# Patient Record
Sex: Female | Born: 1955 | Race: White | Hispanic: No | Smoking: Never smoker
Health system: Southern US, Community
[De-identification: ages and names within clinical notes are randomized; demographics above are authoritative.]

## PROBLEM LIST (undated history)

## (undated) DIAGNOSIS — C801 Malignant (primary) neoplasm, unspecified: Secondary | ICD-10-CM

## (undated) DIAGNOSIS — Z86718 Personal history of other venous thrombosis and embolism: Secondary | ICD-10-CM

## (undated) HISTORY — PX: ABDOMINAL HYSTERECTOMY: SHX81

---

## 2016-02-15 ENCOUNTER — Ambulatory Visit
Admission: EM | Admit: 2016-02-15 | Discharge: 2016-02-15 | Disposition: A | Discharge status: Home / Self Care | Payer: Commercial Managed Care - HMO | Attending: Internal Medicine | Admitting: Internal Medicine

## 2016-02-15 ENCOUNTER — Encounter: Payer: Self-pay | Admitting: *Deleted

## 2016-02-15 DIAGNOSIS — M545 Low back pain: Secondary | ICD-10-CM | POA: Diagnosis not present

## 2016-02-15 HISTORY — DX: Personal history of other venous thrombosis and embolism: Z86.718

## 2016-02-15 HISTORY — DX: Malignant (primary) neoplasm, unspecified: C80.1

## 2016-02-15 MED ORDER — KETOROLAC TROMETHAMINE 60 MG/2ML IM SOLN
60.0000 mg | Freq: Once | INTRAMUSCULAR | Status: AC
  Administered 2016-02-15: 60 mg via INTRAMUSCULAR

## 2016-02-15 MED ORDER — OXYCODONE-ACETAMINOPHEN 5-325 MG PO TABS: 1.0000 | ORAL_TABLET | Freq: Three times a day (TID) | ORAL | Status: AC | PRN

## 2016-02-15 NOTE — ED Notes (Signed)
Patient complains of lower back, right hip pain that travels to the buttocks area. 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 Patient saw an MD and they gave her a dose of Toradol and it seemed to have helped, however patient has been traveling/driving and it is aggravated again.

## 2016-02-15 NOTE — Discharge Instructions (Signed)
Take medication as prescribed. Rest. Drink plenty of fluids. Apply ice. Avoid strenuous activity.   Have INR checked in next 3 days as discussed.   Follow up with your primary care physician this week as needed. Return to Urgent care for increased pain, numbness, difficulty walking, dysuria, abnormal bleeding, new or worsening concerns.    Back Pain, Adult Back pain is very common in adults.The cause of back pain is rarely dangerous and the pain often gets better over time.The cause of your back pain may not be known. Some common causes of back pain include:  Strain of the muscles or ligaments supporting the spine.  Wear and tear (degeneration) of the spinal disks.  Arthritis.  Direct injury to the back. For many people, back pain may return. Since back pain is rarely dangerous, most people can learn to manage this condition on their own. HOME CARE INSTRUCTIONS Watch your back pain for any changes. The following actions may help to lessen any discomfort you are feeling:  Remain active. It is stressful on your back to sit or stand in one place for long periods of time. Do not sit, drive, or stand in one place for more than 30 minutes at a time. Take short walks on even surfaces as soon as you are able.Try to increase the length of time you walk each day.  Exercise regularly as directed by your health care provider. Exercise helps your back heal faster. It also helps avoid future injury by keeping your muscles strong and flexible.  Do not stay in bed.Resting more than 1-2 days can delay your recovery.  Pay attention to your body when you bend and lift. The most comfortable positions are those that put less stress on your recovering back. Always use proper lifting techniques, including:  Bending your knees.  Keeping the load close to your body.  Avoiding twisting.  Find a comfortable position to sleep. Use a firm mattress and lie on your side with your knees slightly bent. If you  lie on your back, put a pillow under your knees.  Avoid feeling anxious or stressed.Stress increases muscle tension and can worsen back pain.It is important to recognize when you are anxious or stressed and learn ways to manage it, such as with exercise.  Take medicines only as directed by your health care provider. Over-the-counter medicines to reduce pain and inflammation are often the most helpful.Your health care provider may prescribe muscle relaxant drugs.These medicines help dull your pain so you can more quickly return to your normal activities and healthy exercise.  Apply ice to the injured area:  Put ice in a plastic bag.  Place a towel between your skin and the bag.  Leave the ice on for 20 minutes, 2-3 times a day for the first 2-3 days. After that, ice and heat may be alternated to reduce pain and spasms.  Maintain a healthy weight. Excess weight puts extra stress on your back and makes it difficult to maintain good posture. SEEK MEDICAL CARE IF:  You have pain that is not relieved with rest or medicine.  You have increasing pain going down into the legs or buttocks.  You have pain that does not improve in one week.  You have night pain.  You lose weight.  You have a fever or chills. SEEK IMMEDIATE MEDICAL CARE IF:   You develop new bowel or bladder control problems.  You have unusual weakness or numbness in your arms or legs.  You develop nausea or  vomiting.  You develop abdominal pain.  You feel faint.   This information is not intended to replace advice given to you by your health care provider. Make sure you discuss any questions you have with your health care provider.   Document Released: 08/21/2005 Document Revised: 09/11/2014 Document Reviewed: 12/23/2013 Elsevier Interactive Patient Education [employer].

## 2016-02-15 NOTE — ED Provider Notes (Signed)
[facility]  ____________________________________________  Time seen: Approximately 3:00 PM  I have reviewed the triage vital signs and the nursing notes.   HISTORY  Chief Complaint Back Pain and Hip Pain   HPI Leslie Humphrey is a 60 y.o. femalepresents with a complaint of lower back pain. Patient reports that she has been having this lower back pain for last 1.5 weeks. Reports that she was seen by her primary care physician for the same this past Thursday and reports that she received an IM Toradol shot which did help the pain. Patient states that her pain in her back had been gradually improving after the Toradol however she and her mother are currently traveling from [place] to this area and states that from sitting in the car and getting in and out of the car frequently for back pain increased. Patient states her back pain currently feels similar to what it did prior to the Toradol injection. 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 Denies any fall herself or direct trauma. Patient states that she felt like she strained her back causing the pain.  Patient reports that she does have a long history of lower back pain and states that previously it has been secondary to degenerative disc disease. Patient denies pain radiation. Denies any numbness or tingling sensation. Denies any urinary or bowel retention or incontinence. Reports ambulating is fine, the pain is more so with position changes. States pain at rest is minimal.   Denies any recent sickness. Patient states that even though she is traveling she is frequently getting out of her car and moving as she does have a history of pulmonary emboli and she is currently on warfarin therapy. Denies any recent change to her warfarin doses but reports has had to have intermittent changes in the past. Reports last INR was 2.1 this past Friday  which was after receiving the Toradol injection.  Denies recent sickness, fevers, dysuria, chest pain, shortness of breath, chest pain with deep breath, abdominal pain, dizziness, weakness, extremity swelling or weakness.  PCP: In the [place] area.    Past Medical History  Diagnosis Date  . History of blood clots     Lungs  . Cancer [facility])     Uterine  htn   There are no active problems to display for this patient.   Past Surgical History  Procedure Laterality Date  . Abdominal hysterectomy      Current Outpatient Rx  Name  Route  Sig  Dispense  Refill             Warfarin 2.5mg  daily Lisinopril Potassium oral   Allergies Review of patient's allergies indicates no known allergies.  Family History  Problem Relation Age of Onset  . Heart Problems Mother   . Diabetes Father     Social History Social History  Substance Use Topics  . Smoking status: Never Smoker   . Smokeless tobacco: Never Used  . Alcohol Use: No    Review of Systems Constitutional: No fever/chills Eyes: No visual changes. ENT: No sore throat. Cardiovascular: Denies chest pain. Respiratory: Denies shortness of breath. Gastrointestinal: No abdominal pain.  No nausea, no vomiting.  No diarrhea.  No constipation. Genitourinary: Negative for dysuria. Musculoskeletal: Positive back pain. Skin: Negative for rash. Neurological: Negative for headaches, focal weakness or numbness.  10-point ROS otherwise negative.  ____________________________________________   PHYSICAL EXAM:  VITAL SIGNS: ED Triage Vitals  Enc Vitals Group     BP 02/15/16 1426 129/90 mmHg     Pulse Rate 02/15/16 1426 83     Resp 02/15/16 1426 18     Temp 02/15/16 1426 98 F (36.7 C)     Temp Source 02/15/16 1426 Oral     SpO2 02/15/16 1426 100 %     Weight 02/15/16 1426 128 lb (58.06 kg)     Height 02/15/16 1426 5\' 4"  (1.626 m)     Head Cir --      Peak Flow --      Pain Score 02/15/16 1431 8     Pain Loc --       Pain Edu? --      Excl. in [place]? --     Constitutional: Alert and oriented. Well appearing and in no acute distress. Eyes: Conjunctivae are normal. PERRL. EOMI. Head: Atraumatic.  EarsNormal external appearance bilaterally.  Nose: No congestion/rhinnorhea.  Mouth/Throat: Mucous membranes are moist.  Oropharynx non-erythematous. Neck: No stridor.  No cervical spine tenderness to palpation. Hematological/Lymphatic/Immunilogical: No cervical lymphadenopathy. Cardiovascular: Normal rate, regular rhythm. Grossly normal heart sounds.  Good peripheral circulation. Respiratory: Normal respiratory effort.  No retractions. Lungs CTAB. no wheezes, rales or rhonchi. Gastrointestinal: Soft and nontender. No distention.  No CVA tenderness. Musculoskeletal: No lower or upper extremity tenderness nor edema.   Bilateral pedal pulses equal and easily palpated.  Except: Mild midline tenderness and left paralumbar tenderness, moderate right paralumbar tenderness with right sciatic notch tenderness, no swelling, no ecchymosis, no erythema. Bilateral straight leg test negative. Steady gait. Changes positions from sitting to standing to ambulating quickly without distress. Bilateral plantar flexion and dorsiflexion strong and equal.No saddle anesthesia. Neurologic:  Normal speech and language. No gross focal neurologic deficits are appreciated. No gait instability.5/5 strength to  Skin:  Skin is warm, dry and intact. No rash noted. Psychiatric: Mood and affect are normal. Speech and behavior are normal.  ____________________________________________   INITIAL IMPRESSION / ASSESSMENT AND PLAN / ED COURSE  Pertinent labs & imaging results that were available during my care of the patient were reviewed by me and considered in my medical decision making (see chart for details).  Very well-appearing patient. No acute distress. Mother at bedside. Presents with complaints of low back pain present 1.5 weeks which  reportedly had began to improve however recent car trip "aggravated" her pain again causing it to be at current level. No focal neurological deficits. Ambulatory in room. Changes positions quickly. Mild midline tenderness and left paralumbar tenderness, moderate right paralumbar tenderness with right sciatic notch tenderness. Suspect lumbosacral strain injury with sciatic inflammation. Discussed evaluation of x-ray and as patient, patient denies direct trauma, and patient declines x-ray at this time. Patient reports that her pain continues will follow up with her primary care physician for imaging.   Will treat patient with 60 mg IM Toradol 1 here today. Quantity #9 and Percocet given indirect do not drive all taking medication. Encouraged ice, stretching and avoidance of strenuous activity. Also discussed in detail for close INR monitoring. Recommend INR check in 3 days. Patient states that she will return to urgent care tomorrow for INR check.Discussed indication, risks and benefits of medications with patient.   Discussed follow up with Primary care physician this week. Discussed follow up and return parameters including no resolution or any worsening concerns. Patient verbalized understanding and agreed to plan.   ____________________________________________   FINAL CLINICAL IMPRESSION(S) / ED  DIAGNOSES  Final diagnoses:  Bilateral low back pain, with sciatica presence unspecified     Discharge Medication List as of 02/15/2016  3:02 PM    START taking these medications   Details  oxyCODONE-acetaminophen (ROXICET) 5-325 MG tablet Take 1 tablet by mouth every 8 (eight) hours as needed for moderate pain or severe pain (Do not drive or operate heavy machinery while taking as can cause drowsiness.)., Starting 02/15/2016, Until Discontinued, Print        Note: This dictation was prepared with Dragon dictation along with smaller phrase technology. Any transcriptional errors that result from  this process are unintentional.      Marylene Land, NP 02/15/16 [place], NP 02/15/16 [phone]

## 2021-06-08 IMAGING — CR XR CHEST 1 VIEW
1 series · 1 of 1 positions shown · non-contrast
Comparison: None

Shortness of breath
FINAL REPORT:
Chest portable one view
INDICATION: dyspnea

[AP]
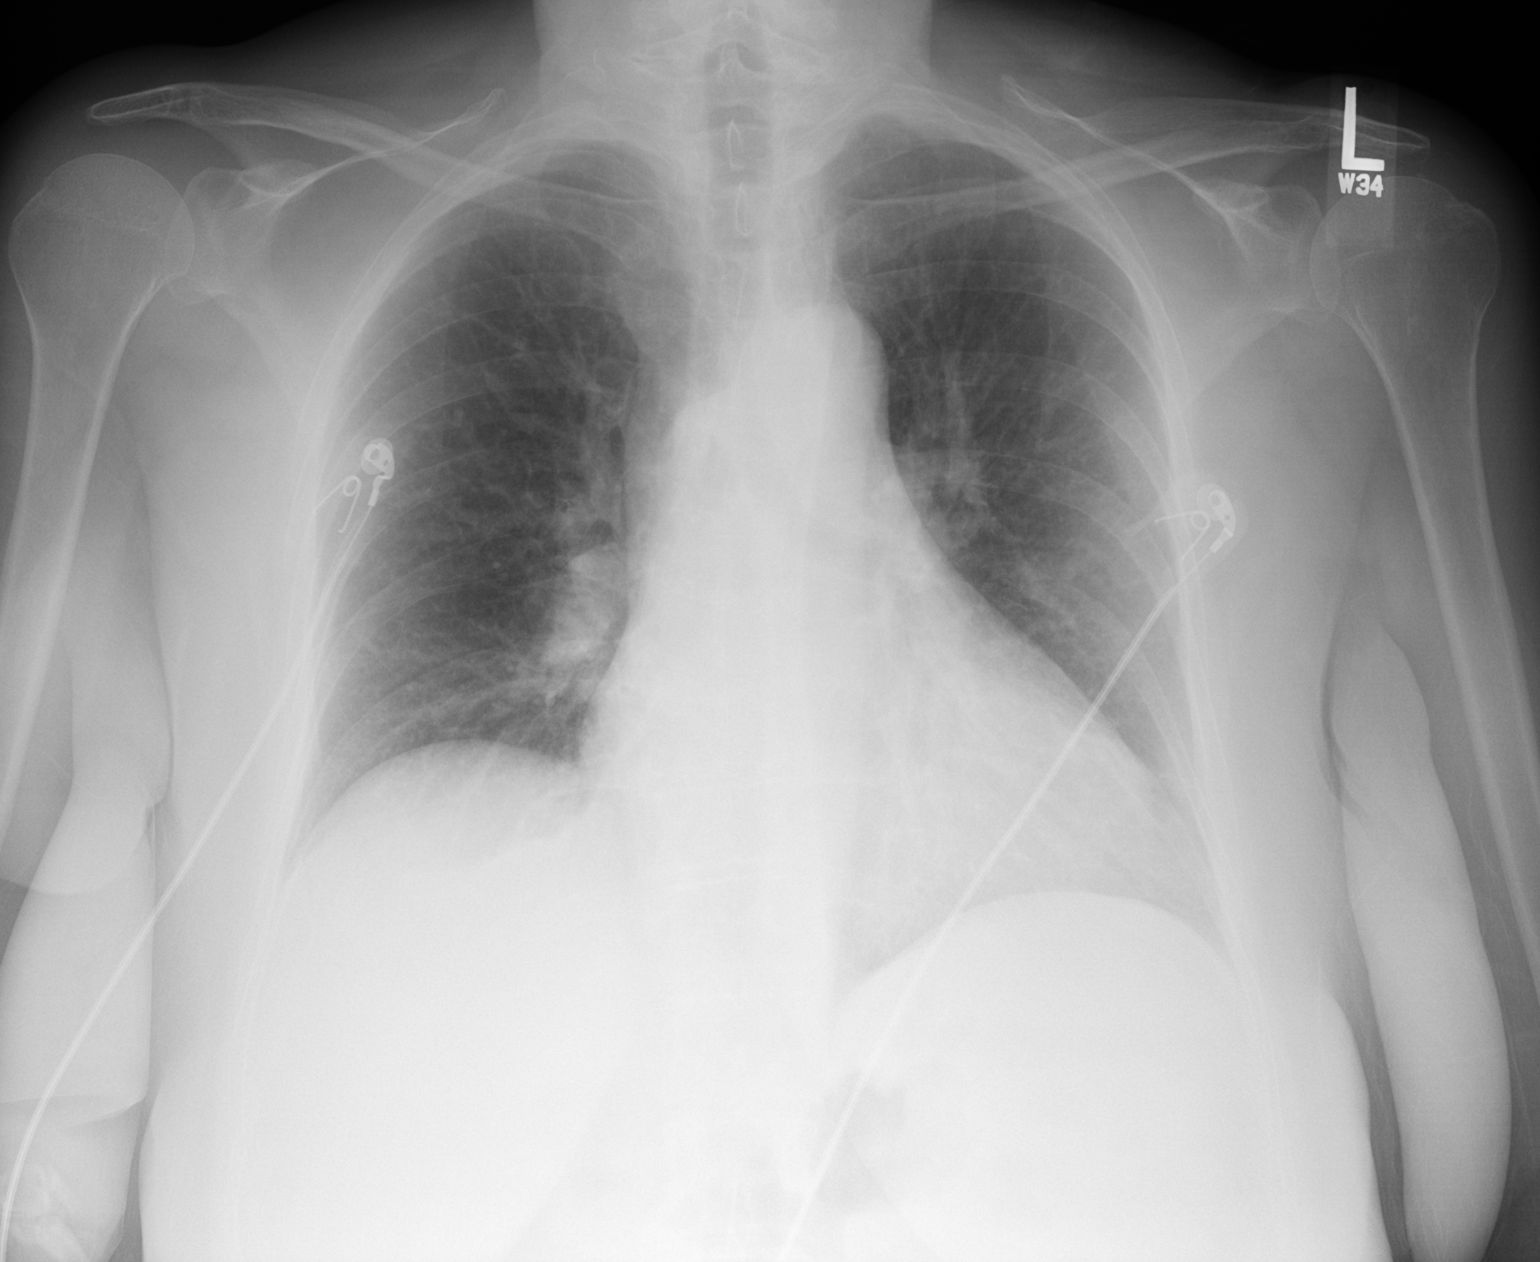

[1 of 1 positions shown; findings below may reference images not displayed]

FINDINGS: The cardiac silhouette is mildly enlarged given portable technique.  The lungs are clear.   No pneumothorax. No pleural effusion.   No acute osseous abnormalities.
IMPRESSION: 
IMPRESSION: No acute cardiopulmonary process.     Mild cardiomegaly.

## 2021-06-08 IMAGING — CT CT CHEST PULMONARY EMBOLISM WITH IV CONTRAST
3 of 6 series · 19 of 36 positions shown · IV contrast (agent unspecified)
Comparison: Earlier today.

CHEST PAIN. HX OF PE. HX OF UTERINE TAGIC. NO SX HX.
FINAL REPORT:
EXAM: CT CHEST PULMONARY EMBOLISM WITH IV CONTRAST
INDICATION: Pulmonary embolism (PE) suspected, positive D-dimer
TECHNIQUE: CTA chest with IV contrast using multiplanar reconstructions. 3D postprocessing and MIPs were created.
All CT scans at this facility use dose modulation and/or weight based dosing when appropriate to reduce radiation dose to as low as reasonably achievable. (#[id number]#)

[Series 2: chest pe · axial · 0.71mm/px · z∈[-230,+20]mm · 10 of 124 slices shown]
[im 12/124  lung]
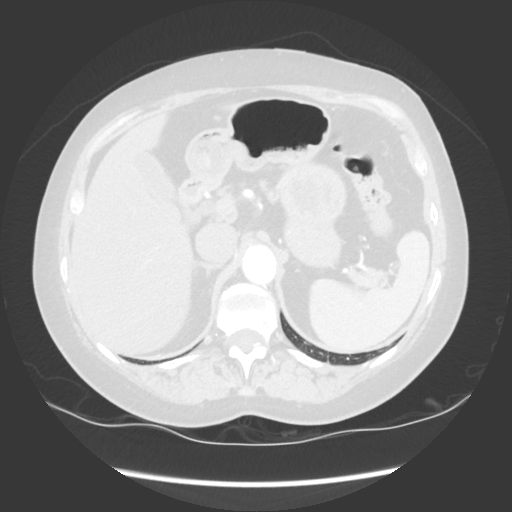
[im 23/124  mediastinal]
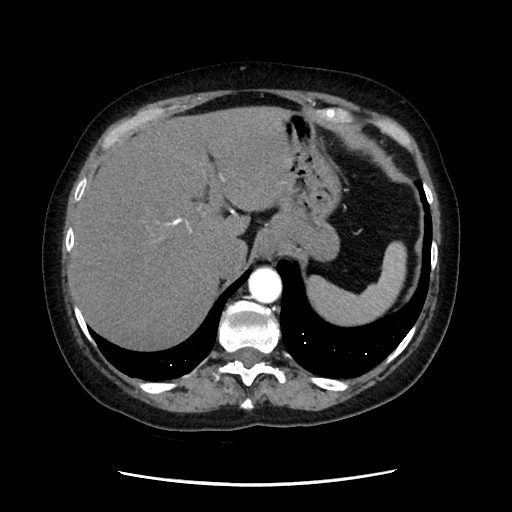
[im 34/124  lung]
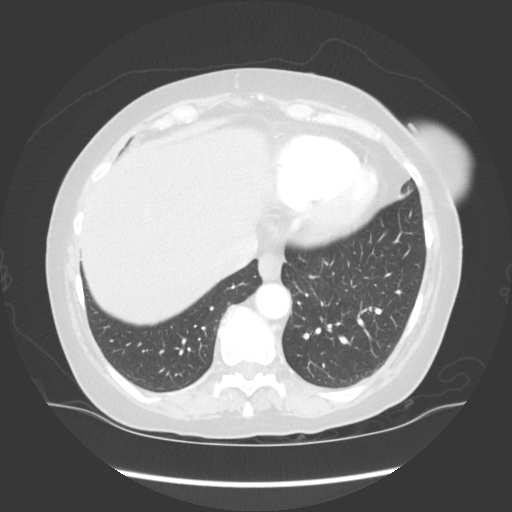
[im 45/124  mediastinal]
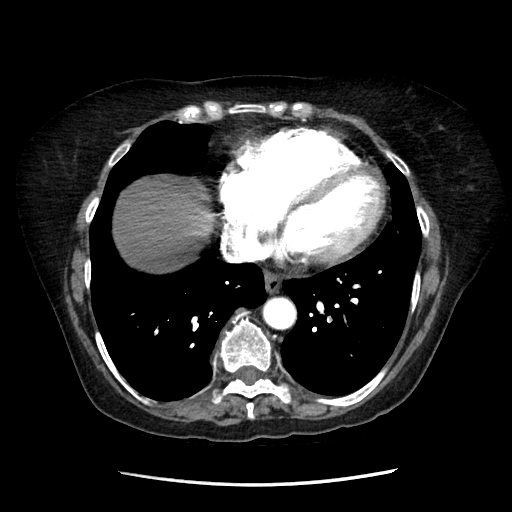
[im 56/124  lung]
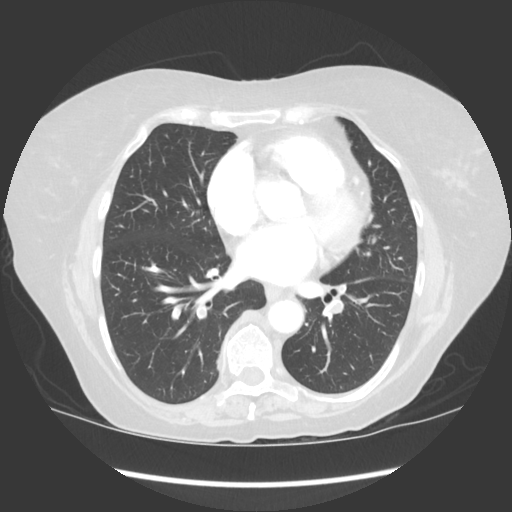
[im 68/124  mediastinal]
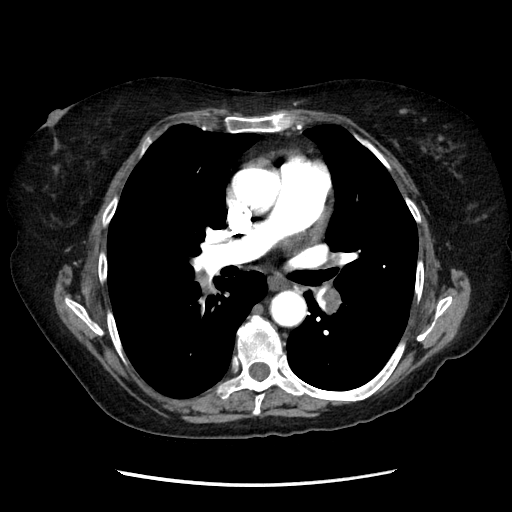
[im 79/124  lung]
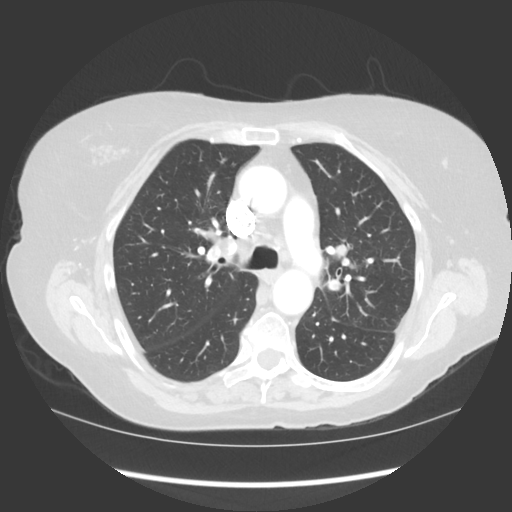
[im 90/124  mediastinal]
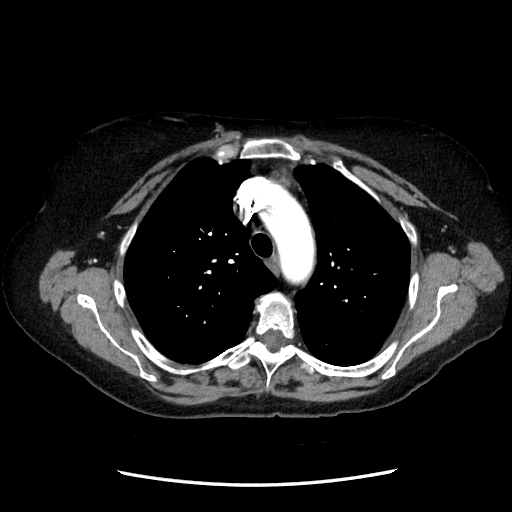
[im 101/124  lung]
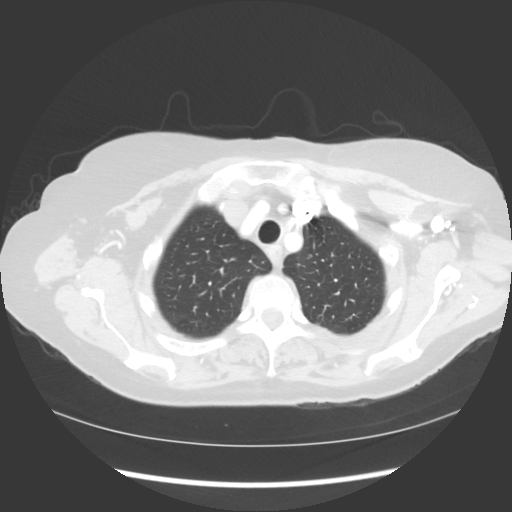
[im 112/124  mediastinal]
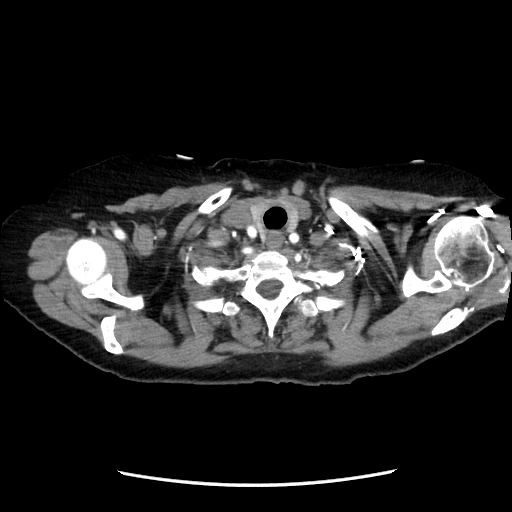

[Series 301: lung · axial · 0.71mm/px · z∈[-230,-8]mm · 7 of 124 slices shown]
[im 12/124  mediastinal]
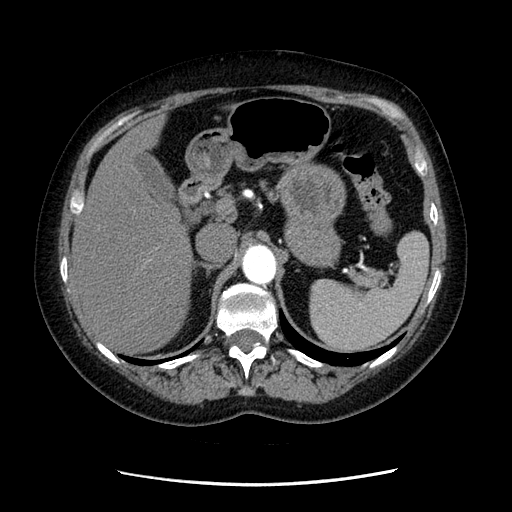
[im 23/124  mediastinal]
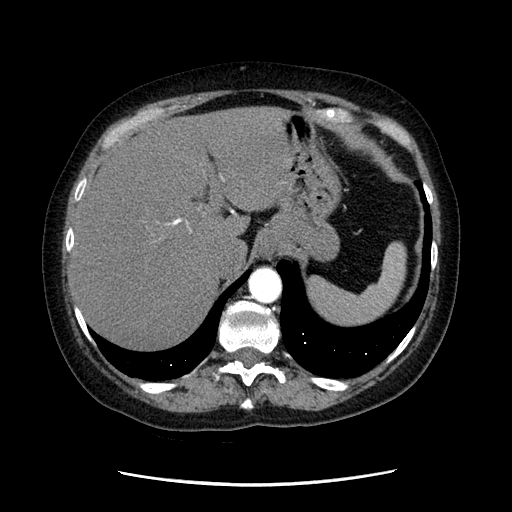
[im 45/124  mediastinal]
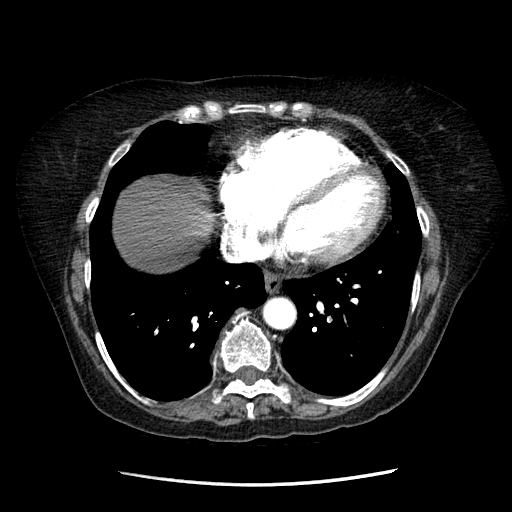
[im 56/124  mediastinal]
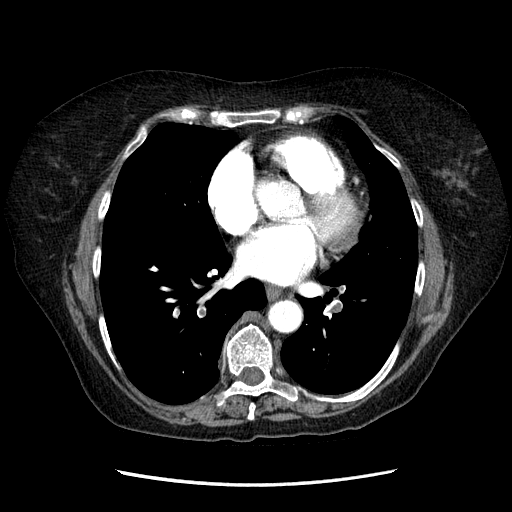
[im 68/124  mediastinal]
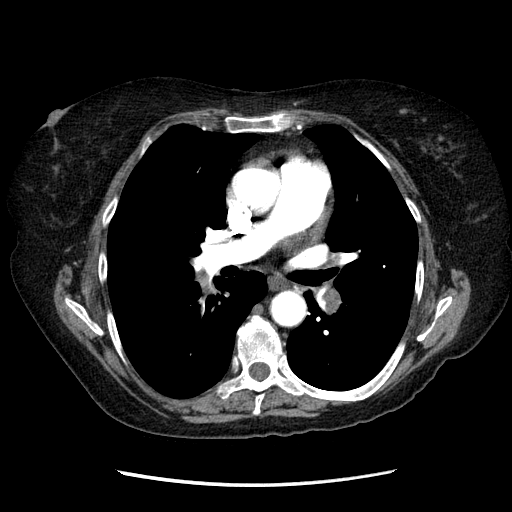
[im 79/124  mediastinal]
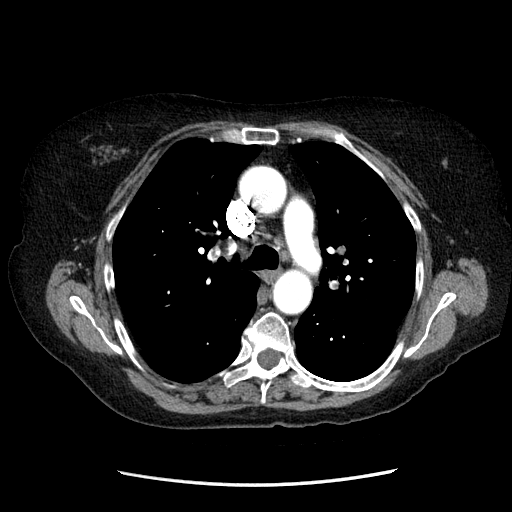
[im 101/124  mediastinal]
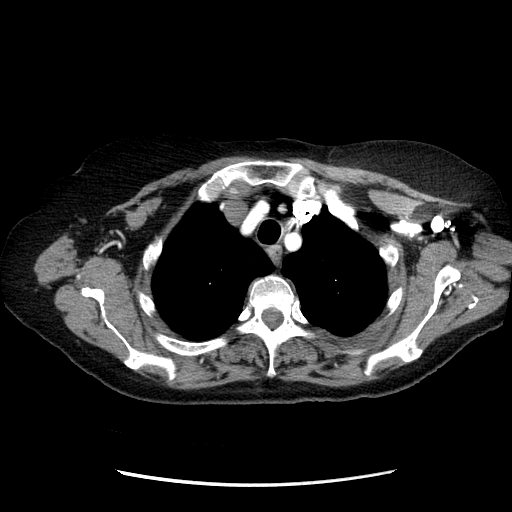

[Series 601: coronal · coronal · 0.71mm/px · 2 of 115 slices shown]
[im 39/115  mediastinal]
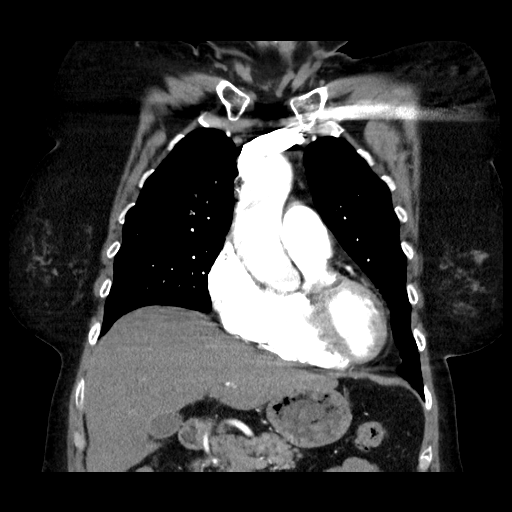
[im 77/115  mediastinal]
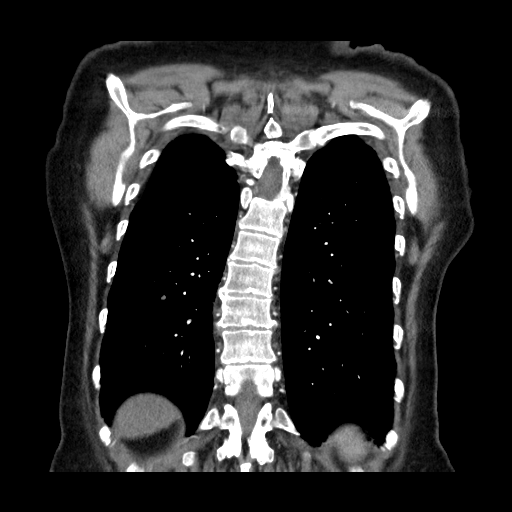

[19 of 36 positions shown; findings below may reference images not displayed]

FINDINGS: HEART: Unremarkable.
PULMONARY VASCULATURE: This study is adequate for the evaluation of pulmonary embolism. Acute filling defects within the bilateral main pulmonary arteries extending into the subsegmental arteries of all lobes.
AORTA: Unremarkable.
MEDIASTINUM \T\ HILA: No lymphadenopathy.
CHEST WALL \T\ AXILLA: Unremarkable.
AIRWAYS \T\ LUNGS: Central airways are patent. Lungs are clear.
BONES: Degenerative changes throughout the visualized spine.
UPPER ABDOMEN: Unremarkable.
IMPRESSION: Acute bilateral pulmonary emboli extending from the main pulmonary arteries into the subsegmental arteries. No right heart strain.
CRITICAL FINDINGS: Critical findings were conveyed to MILUTIN MICO LICO on 06/08/2021 [DATE] by Bozdag, Wim-Jan via [HOSPITAL] secure communication messaging application.

## 2021-06-09 IMAGING — CT CT HEAD WITHOUT CONTRAST
3 of 4 series · 16 of 47 positions shown, 19 images · non-contrast
Comparison: none

New onset ha frontal after starting anticoagulation
Cervical  ca-hysterectomy
FINAL REPORT:
CT head without contrast
HISTORY: Stroke, hemorrhagic
New acute onset headache in the setting of anticoagulation.  Please evaluate for hemorrhagic stroke headache
TECHNIQUE: CT of the head was performed without IV contrast, and axial computed tomography images were taken between the skull base and vertex. Bone windows and soft tissue windows are available for interpretation.

[Series 2: head w/o · axial · non-contrast · 0.49mm/px · z∈[-22,+103]mm · 10 of 31 slices shown, 13 images]
[im 3/31  brain]
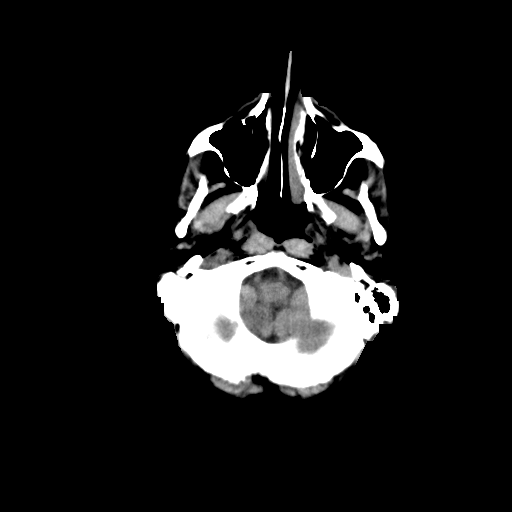
[im 3/31  bone]
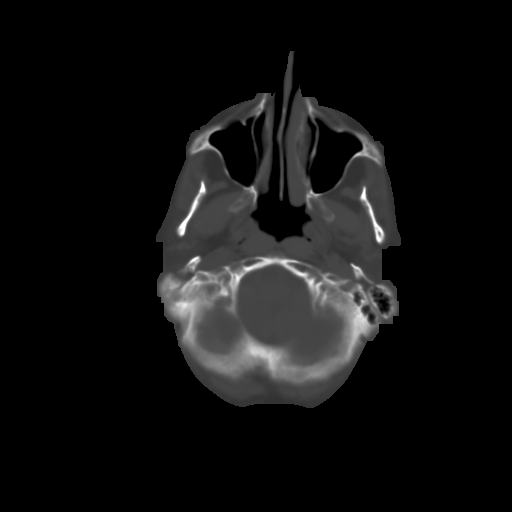
[im 5/31  brain]
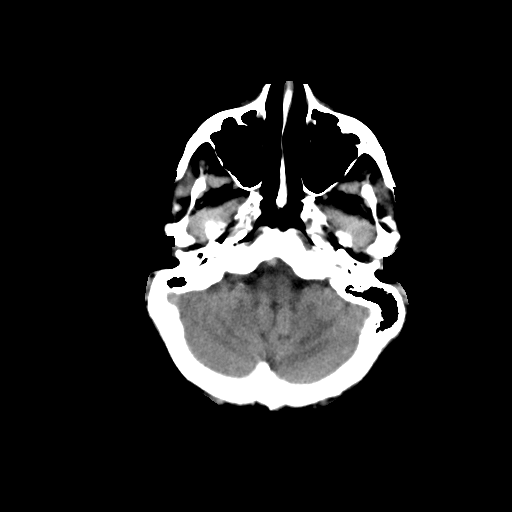
[im 9/31  brain]
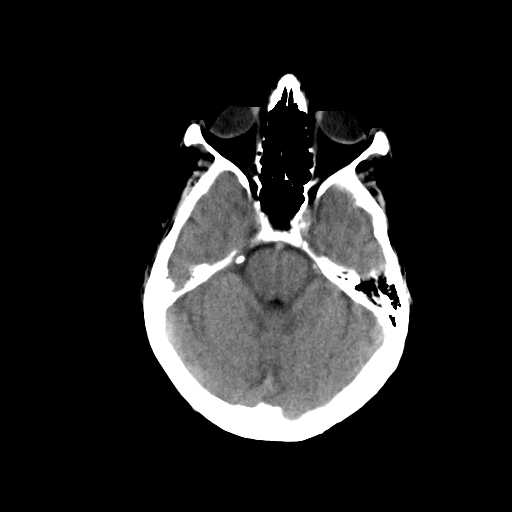
[im 11/31  brain]
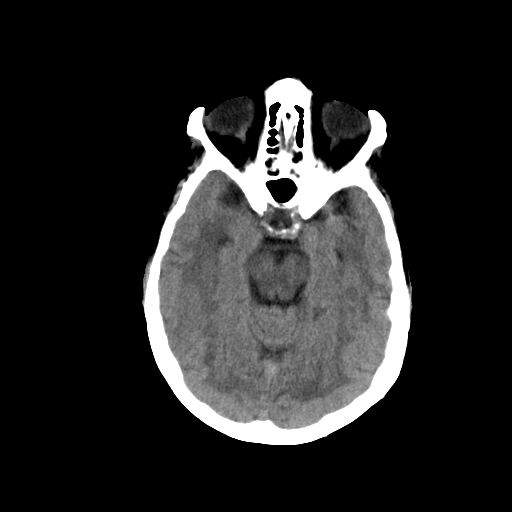
[im 13/31  brain]
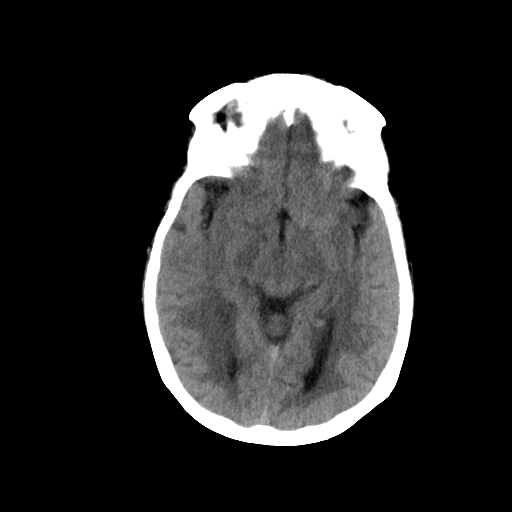
[im 13/31  bone]
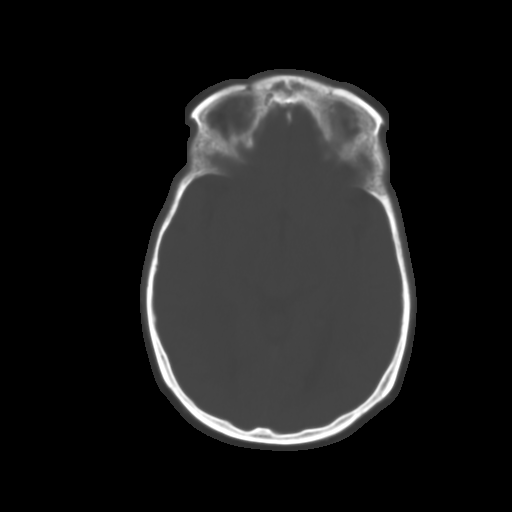
[im 18/31  brain]
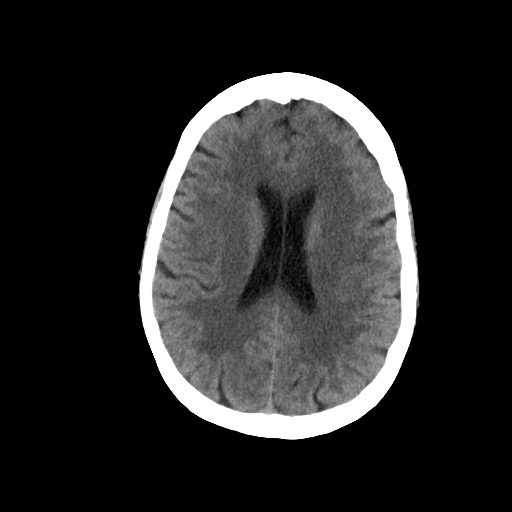
[im 20/31  brain]
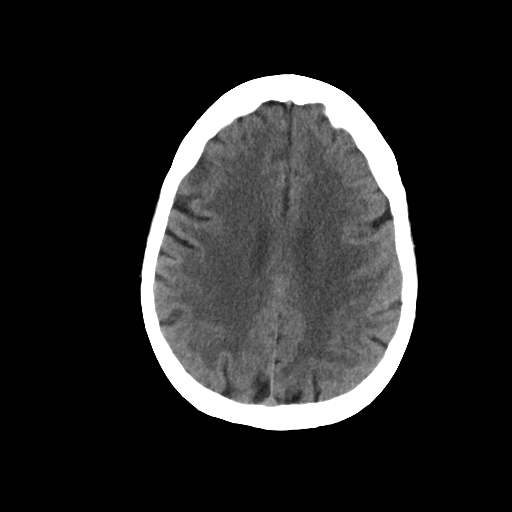
[im 22/31  brain]
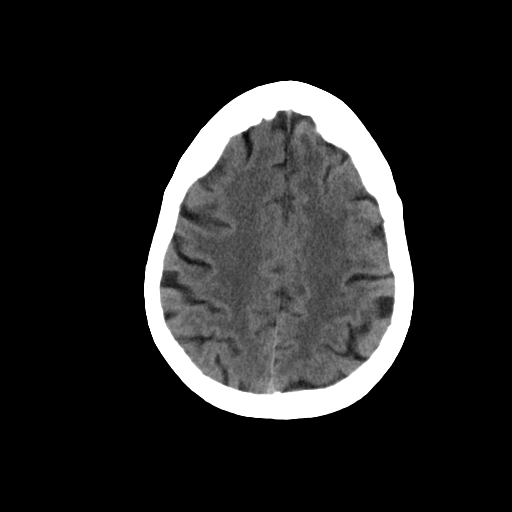
[im 26/31  brain]
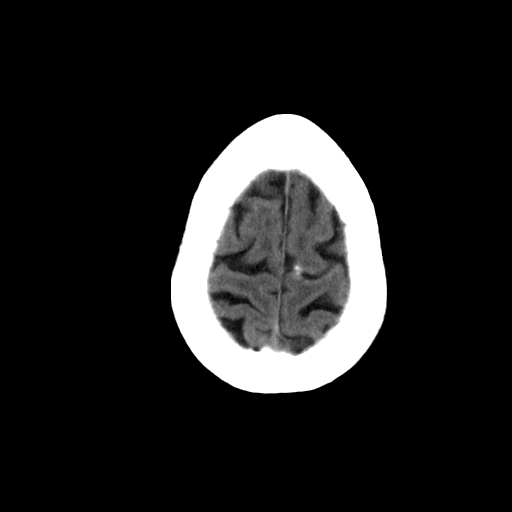
[im 26/31  bone]
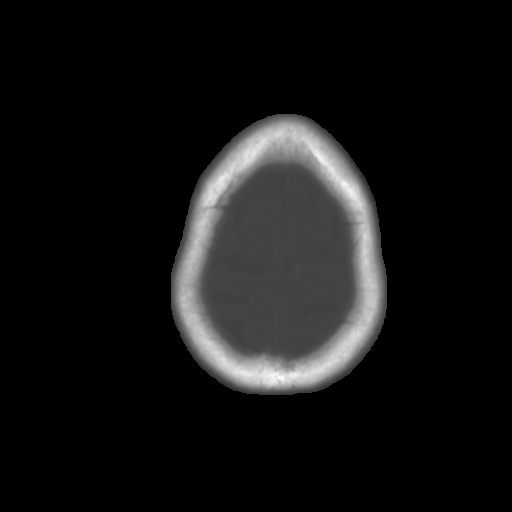
[im 28/31  brain]
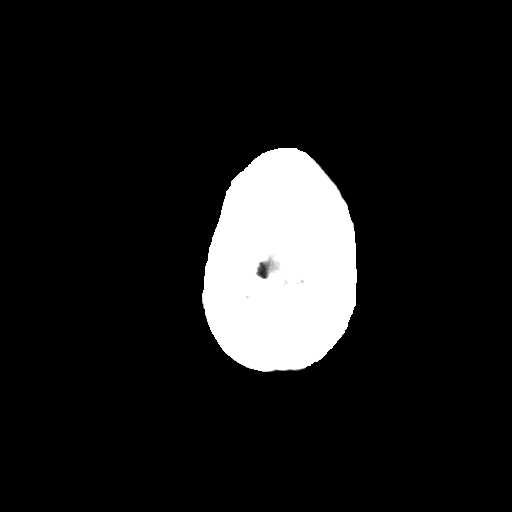

[Series 601: coronal · coronal · 0.49mm/px · 3 of 48 slices shown]
[im 16/48  brain]
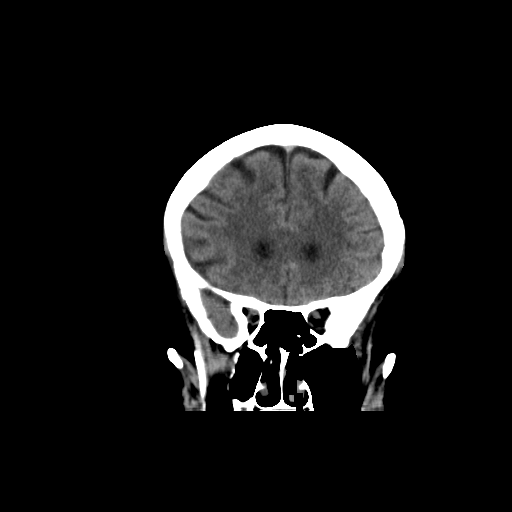
[im 21/48  brain]
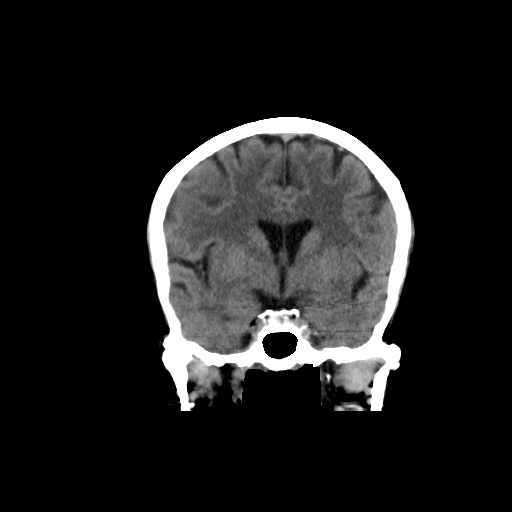
[im 27/48  brain]
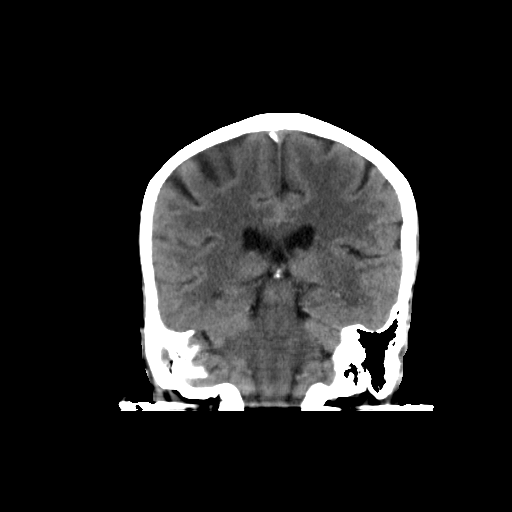

[Series 602: sagittal · sagittal · 0.49mm/px · 3 of 48 slices shown]
[im 16/48  brain]
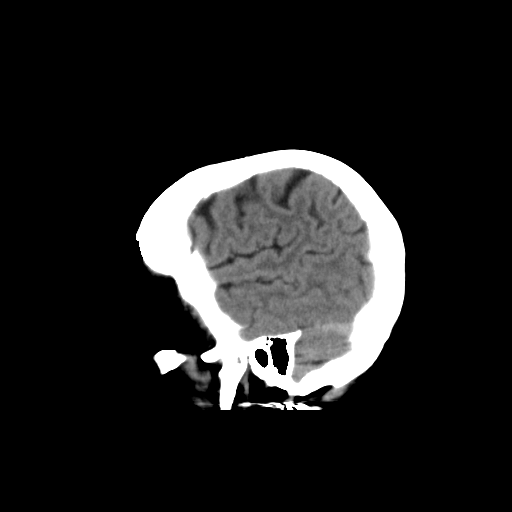
[im 24/48  brain]
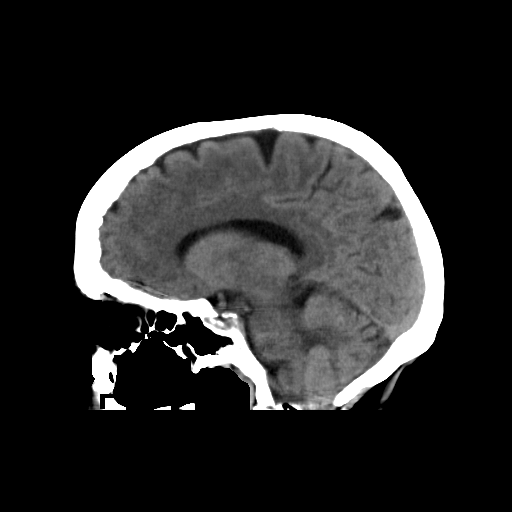
[im 32/48  brain]
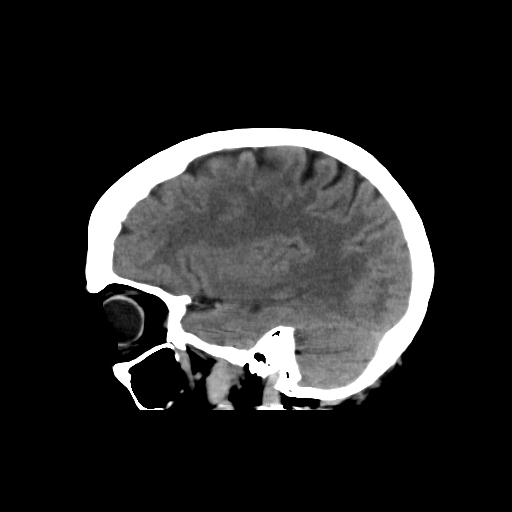

[16 of 47 positions shown; findings below may reference images not displayed]

FINDINGS: Falcine calcifications at the vertex. The native lenses are surgically absent.There is no evidence of any hemorrhage, midline shift, mass effect. The ventricles and sulci are normal in their size, shape, and configuration.  The gray-white differentiation is preserved. Visualized bones are normal. Visualized paranasal sinuses are clear.
IMPRESSION: 
IMPRESSION: No acute intracranial abnormality.
All CT scans at this facility use dose modulation and/or weight based dosing when appropriate to reduce radiation dose to as low as reasonably achievable.

## 2022-01-11 IMAGING — MG MAMMO BREAST SCREENING TOMOSYNTHESIS 3D BILATERAL
8 of 19 series · 8 of 40 positions shown · non-contrast
Comparison: [id number] and [id number].

This is a summary report. The complete report is available in the patient's medical record. If you cannot access the medical record, please contact the sending organization for a detailed fax or copy.
FINAL REPORT:
EXAM: MAMMO BREAST SCREENING TOMOSYNTHESIS 3D BILATERAL
CLINICAL HISTORY: Screening mammogram. No complaints. Family history of breast carcinoma in the patient's sister at age 58.
TECHNIQUE: MLO and CC views of both breasts with tomosynthesis were obtained on a digital full-field mammographic unit. This examination was interpreted in conjunction with computer aided detection system. Nipple and skin markers were placed as needed.

[L XCCL]
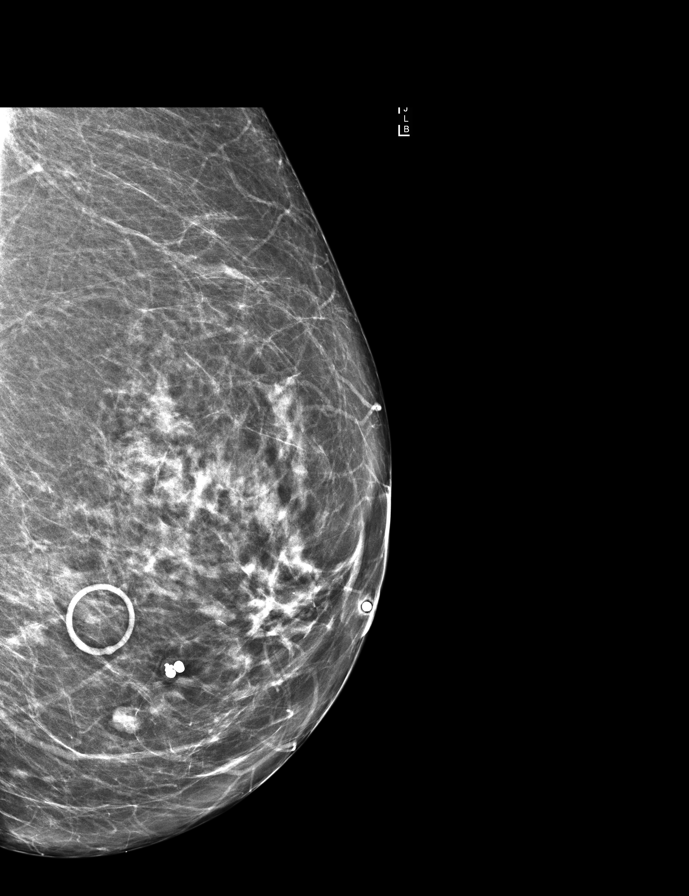

[R XCCL]
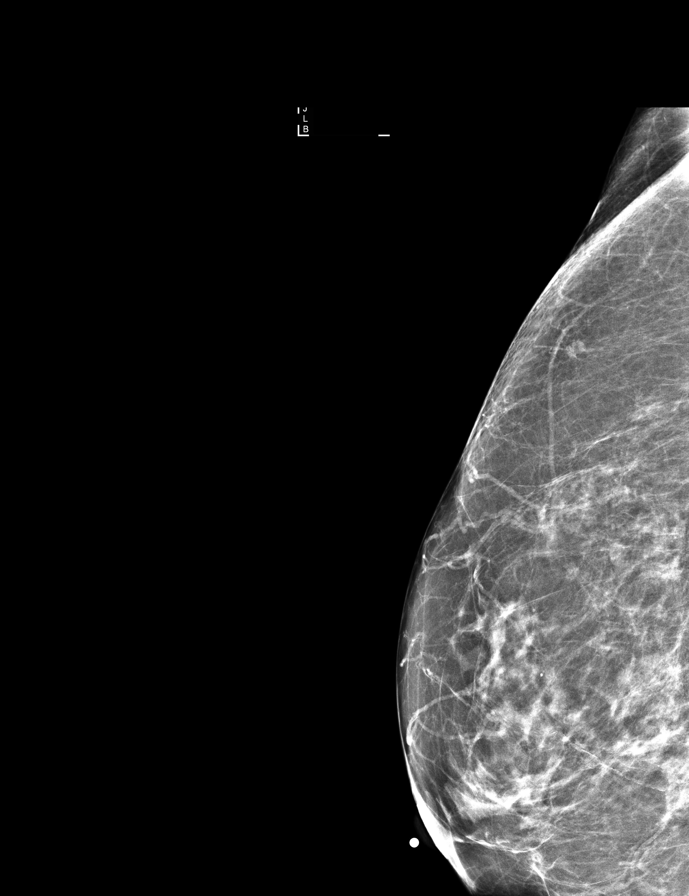

[R MLO synth-2D]
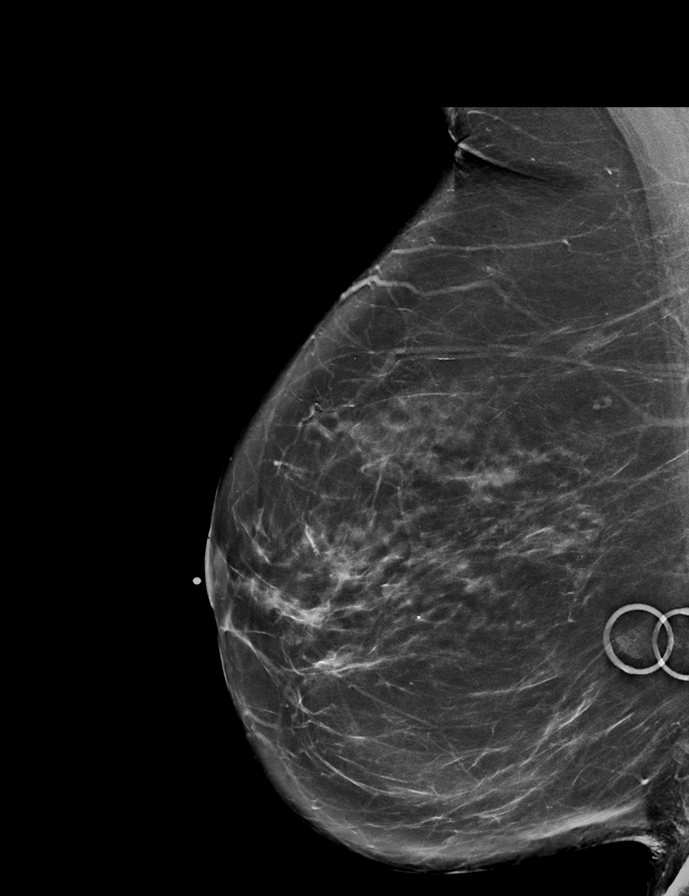

[R MLO (1 of 2)]
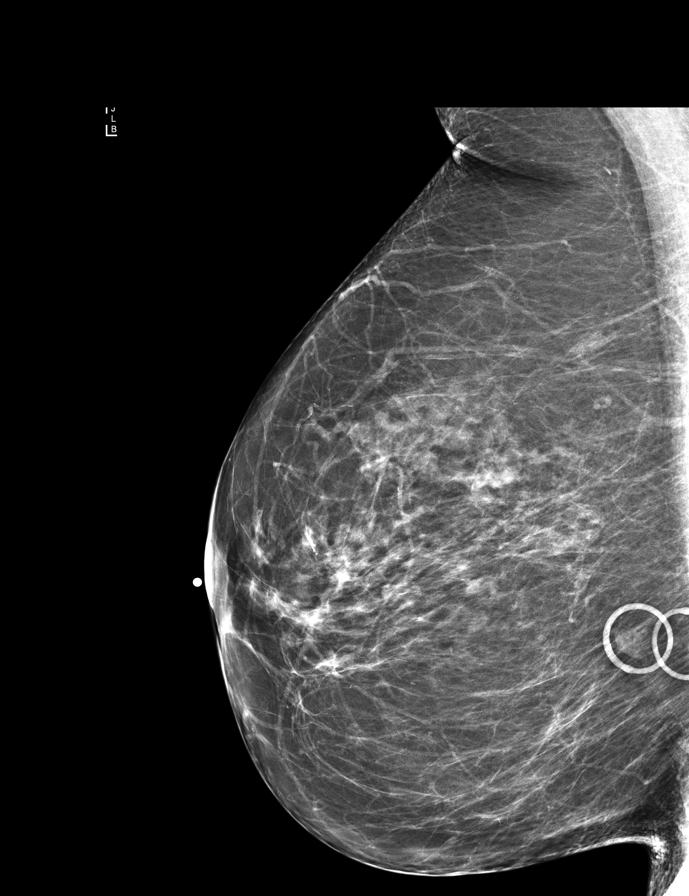

[R CC]
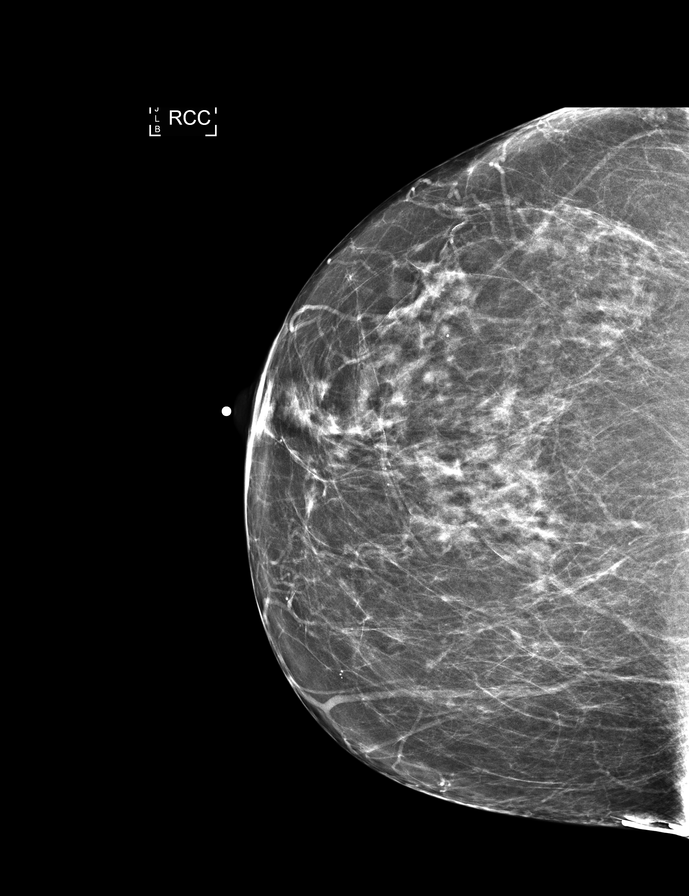

[L CC]
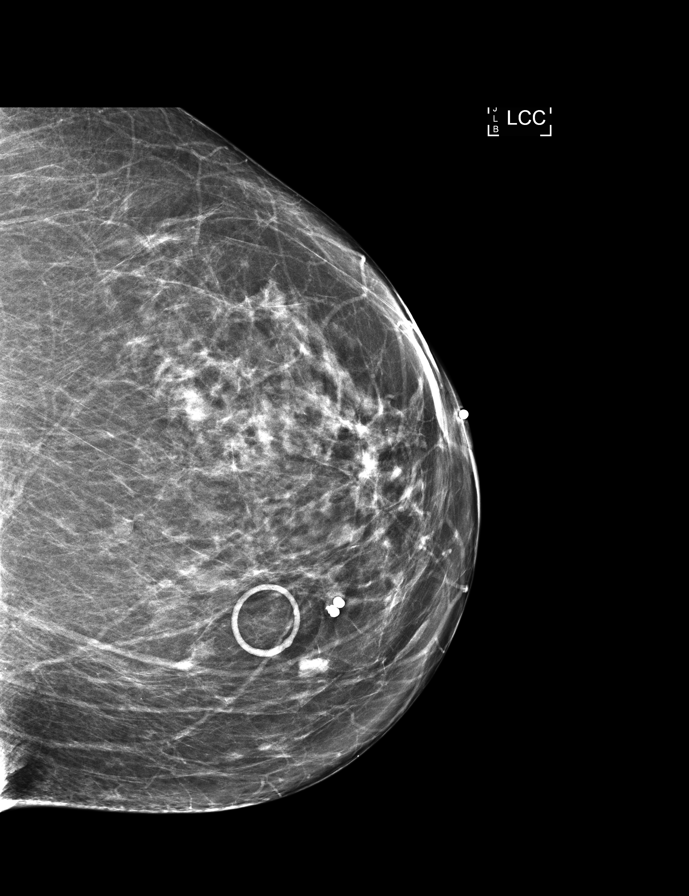

[L CC synth-2D]
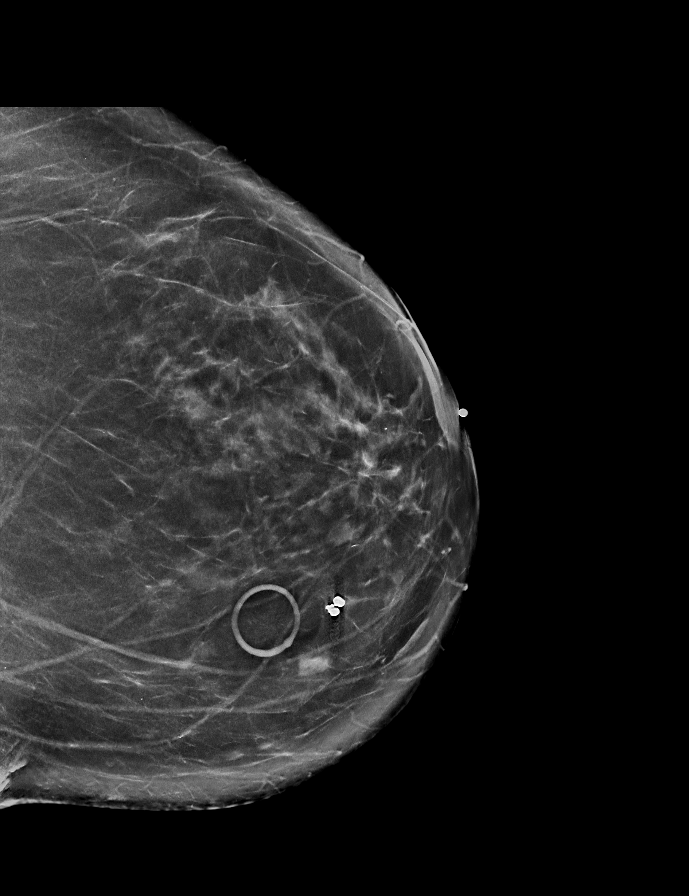

[R MLO (2 of 2)]
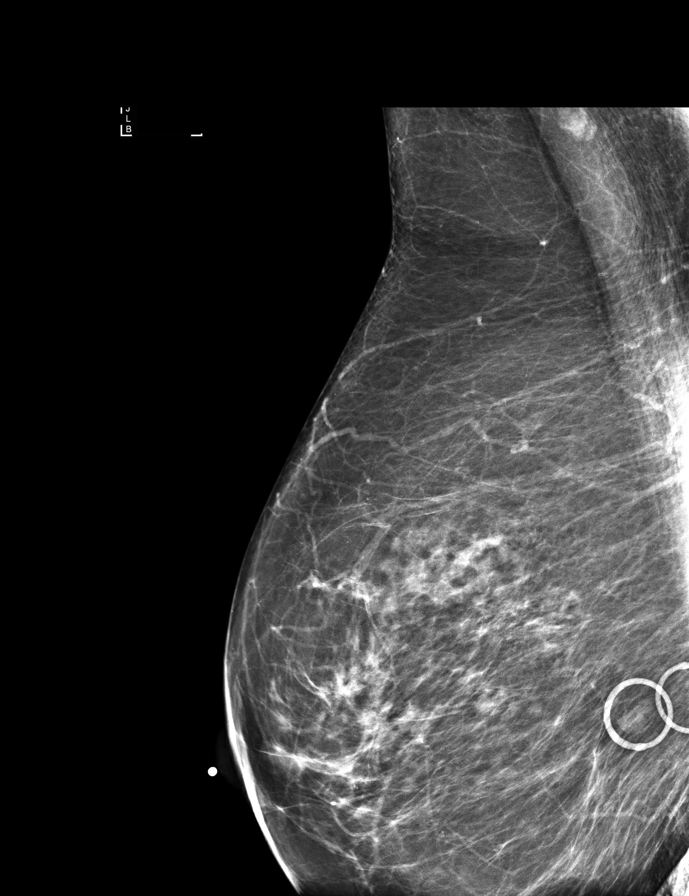

[8 of 40 positions shown; findings below may reference images not displayed]

FINDINGS: BREAST DENSITY TYPE B: There are scattered areas of fibroglandular density.
When compared to the prior exam, there has been no significant interval change. The skin, nipples, and both axillae are unremarkable. There is no evidence of a dominant mass, suspicious cluster of calcifications, or architectural distortion in either breast.
IMPRESSION: No mammographic evidence of malignancy.
BI-RADS Category 1: Negative
Recommendation(s): Bilateral screening mammogram in one year.
The patient information will be entered into a reminder system with a target due date for the next mammogram.

## 2022-11-10 IMAGING — CT CT ABDOMEN PELVIS WITHOUT CONTRAST
2 of 3 series · 17 of 46 positions shown, 19 images · non-contrast
Comparison: CT chest from 06/08/2021.

FINAL REPORT:
EXAM: CT ABDOMEN AND PELVIS WITHOUT CONTRAST
HISTORY: Abdominal pain, acute, nonlocalized
TECHNIQUE: Contiguous axial images were obtained through the abdomen and pelvis without administration of intravenous contrast. Sagittal and coronal reformatted images were generated.
All CT scans at this facility use dose modulation and/or weight based dosing when appropriate to reduce radiation dose to as low as reasonably achievable.

[Series 2: renal stone · axial · 0.98mm/px · z∈[-384,+23]mm · 14 of 189 slices shown, 16 images]
[im 13/189  soft-tissue]
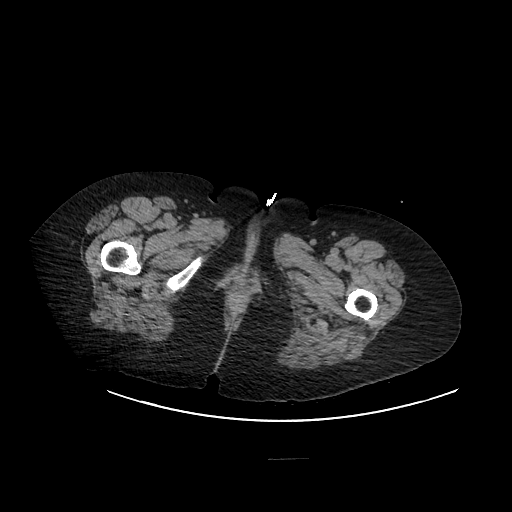
[im 13/189  bone]
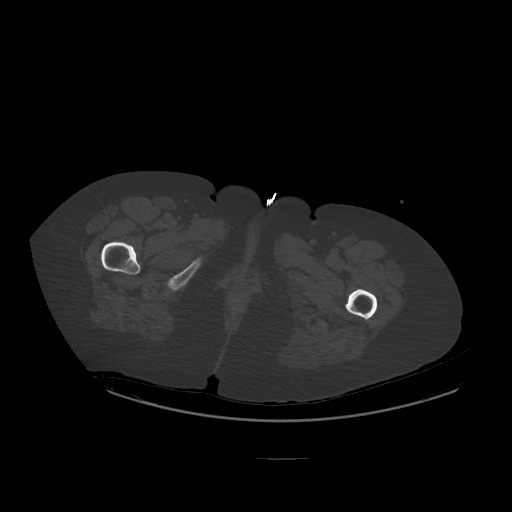
[im 25/189  soft-tissue]
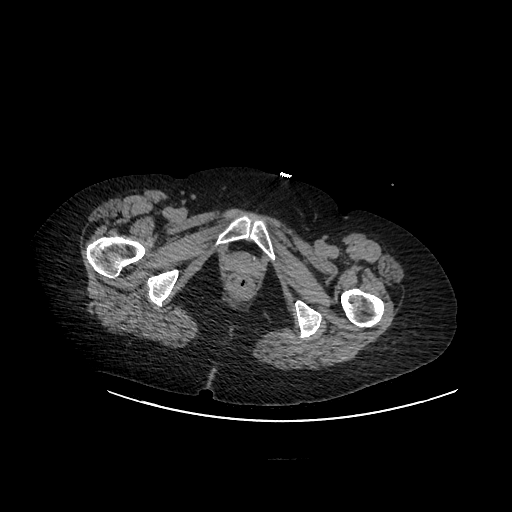
[im 37/189  soft-tissue]
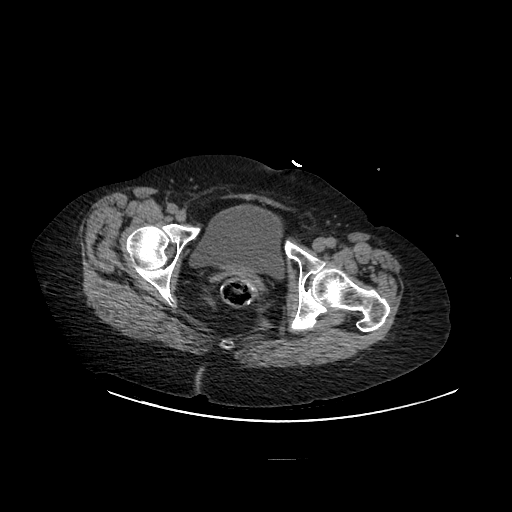
[im 49/189  soft-tissue]
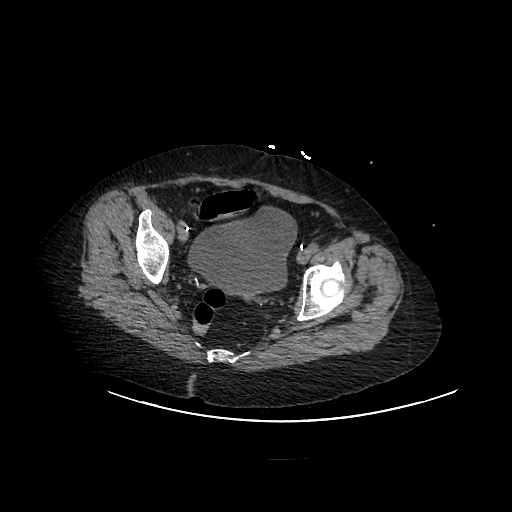
[im 61/189  soft-tissue]
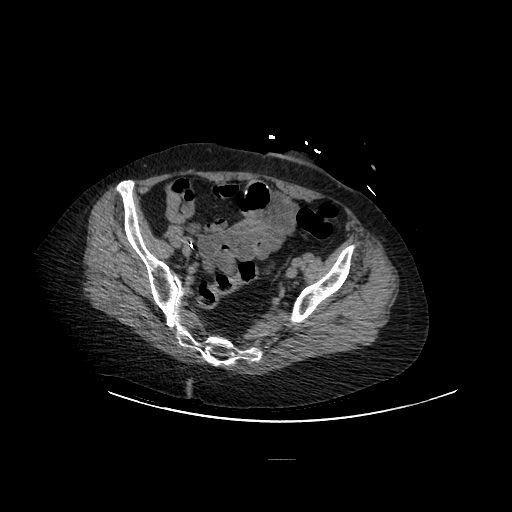
[im 73/189  soft-tissue]
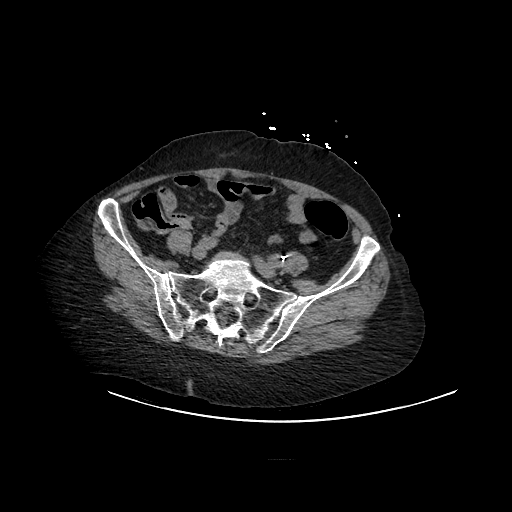
[im 85/189  soft-tissue]
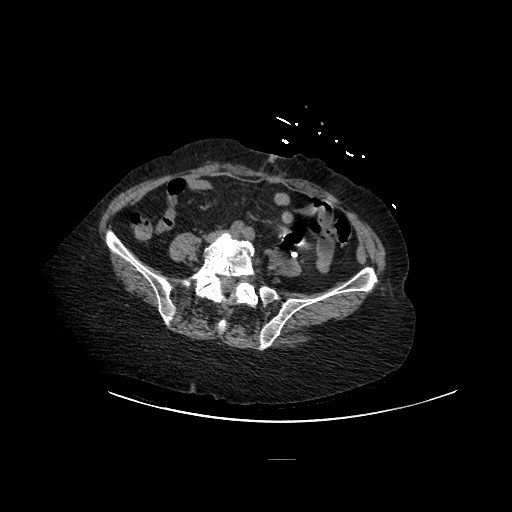
[im 104/189  soft-tissue]
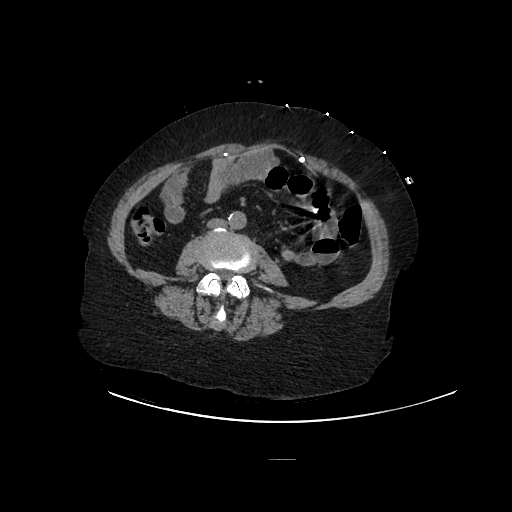
[im 116/189  soft-tissue]
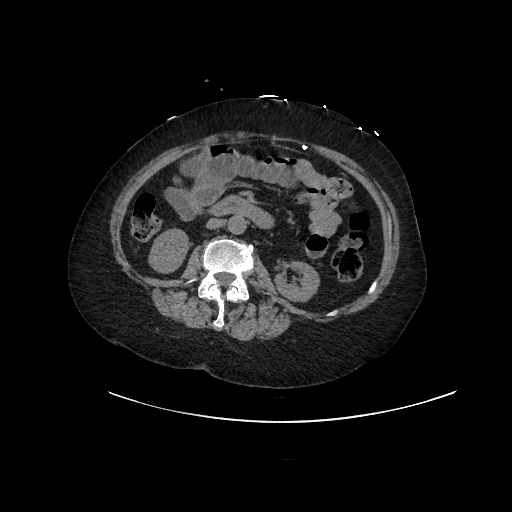
[im 116/189  bone]
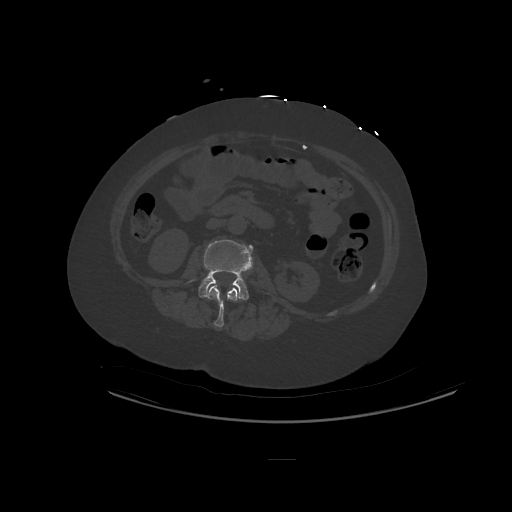
[im 128/189  soft-tissue]
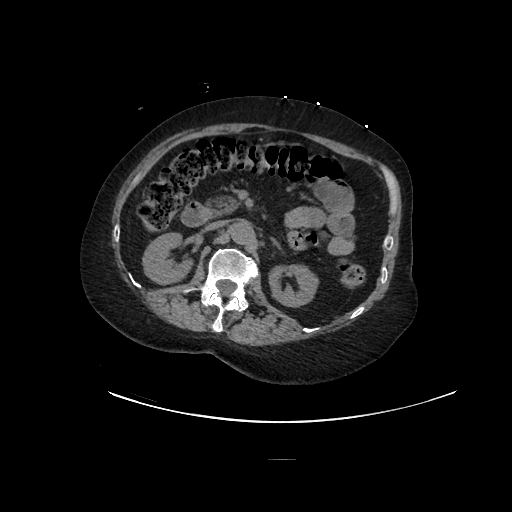
[im 140/189  soft-tissue]
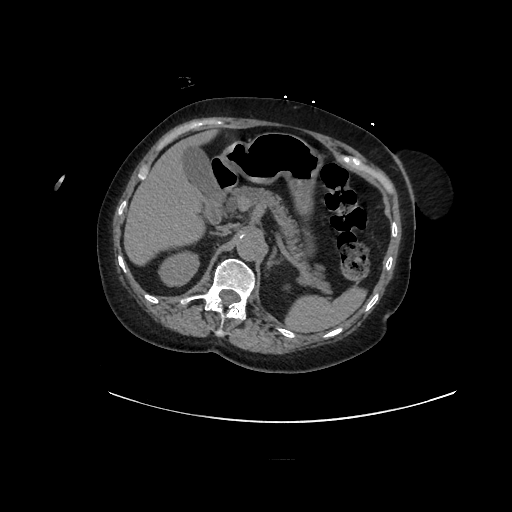
[im 152/189  soft-tissue]
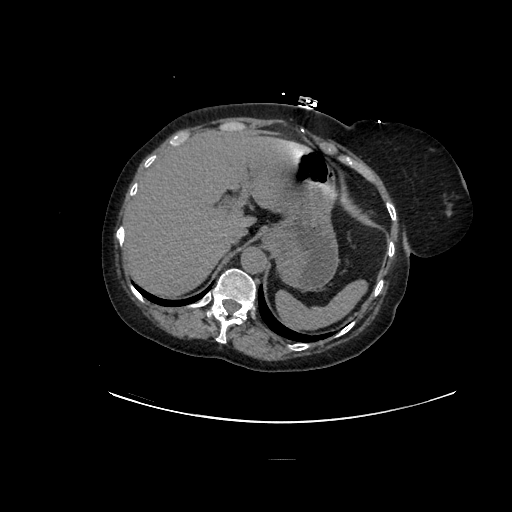
[im 164/189  soft-tissue]
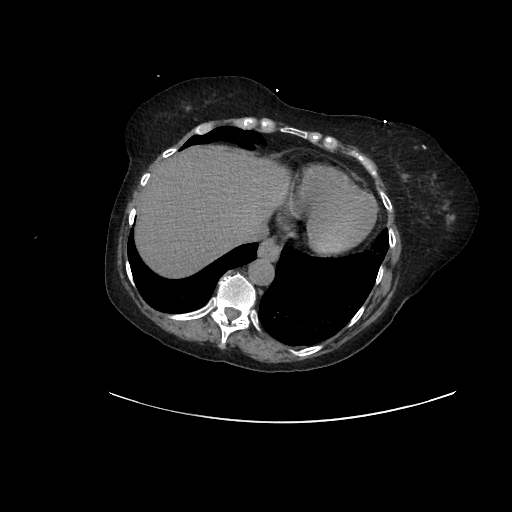
[im 176/189  soft-tissue]
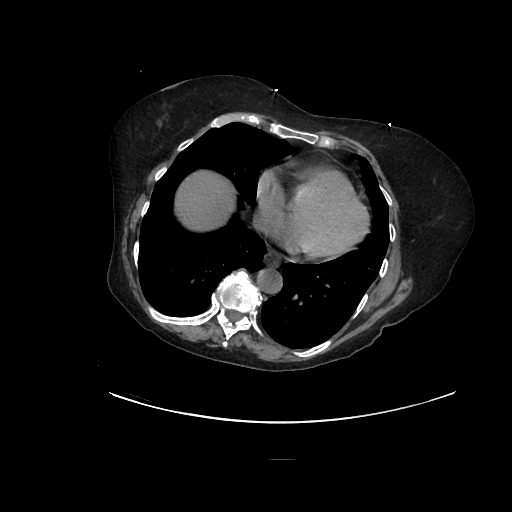

[Series 602: sag standard 2x2 · sagittal · 0.98mm/px · 3 of 221 slices shown]
[im 74/221  soft-tissue]
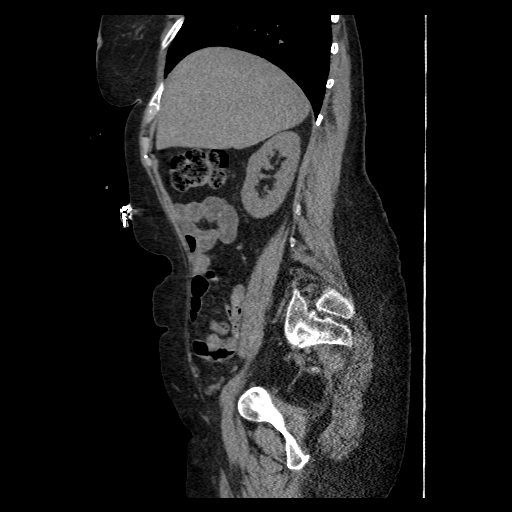
[im 98/221  soft-tissue]
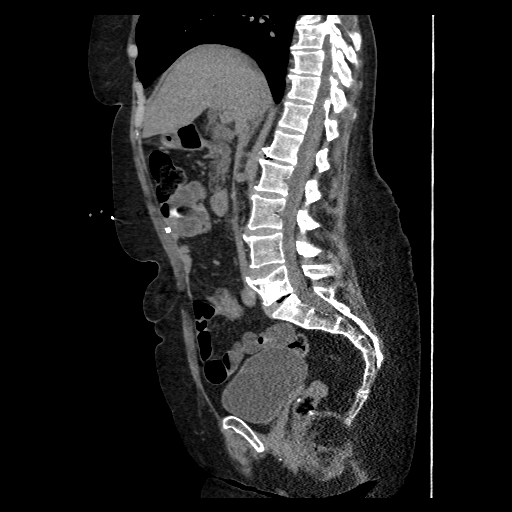
[im 123/221  soft-tissue]
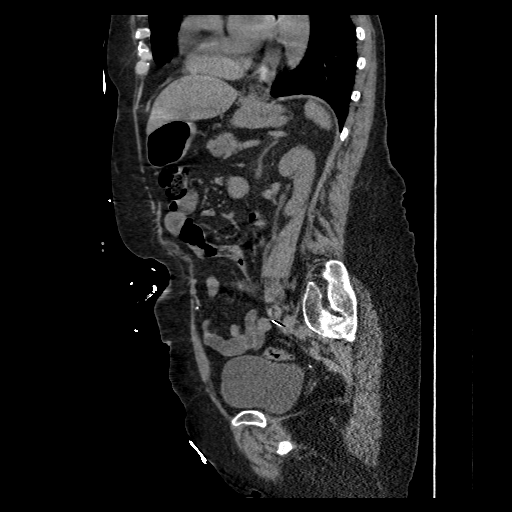

[17 of 46 positions shown; findings below may reference images not displayed]

FINDINGS: LOWER CHEST: Mild reticulonodular opacities of the right middle lobe.
LIVER: No significant abnormality
GALLBLADDER/BILIARY TREE: No significant abnormality
PANCREAS: No significant abnormality
SPLEEN: No significant abnormality
ADRENALS: No significant abnormality
KIDNEYS: No significant abnormality. No renal or ureteral stone or hydronephrosis.
BLADDER: No significant abnormality
REPRODUCTIVE ORGANS: Uterus is absent. No significant adnexal abnormality.
STOMACH / BOWEL: Small hiatal hernia. The proximal small bowel is upper normal in caliber measuring 2.7 cm. Questionable transition point in the left anterior abdomen (axial image 71 and coronal image 56). Distal small bowel is decompressed. No small bowel or colonic wall thickening. Postoperative changes of the rectosigmoid.
LYMPH NODES: No significant adenopathy.
VASCULATURE: No significant abnormality.
OTHER: Postoperative changes of the anterior abdominal wall. No residual hernia. No free air, free fluid, or focal fluid collection.
SKELETAL SYSTEM: No acute osseous findings. Vertebroplasty changes at L5.
IMPRESSION: 1. Mildly dilated proximal small bowel with questionable transition point in the left anterior abdomen, as above. Findings are suspicious for low-grade small bowel obstruction.
2. Mild reticulonodular opacities of the right middle lobe, suspicious for atypical infectious or inflammatory process.
3. Other chronic and incidental findings, as above.

## 2022-11-10 IMAGING — CR XR CHEST 1 VIEW
1 series · 1 of 1 positions shown · non-contrast
Comparison: 06/08/2021

FINAL REPORT:
XR CHEST 1 VIEW
INDICATION / CLINICAL INFORMATION:
chest pain

[AP]
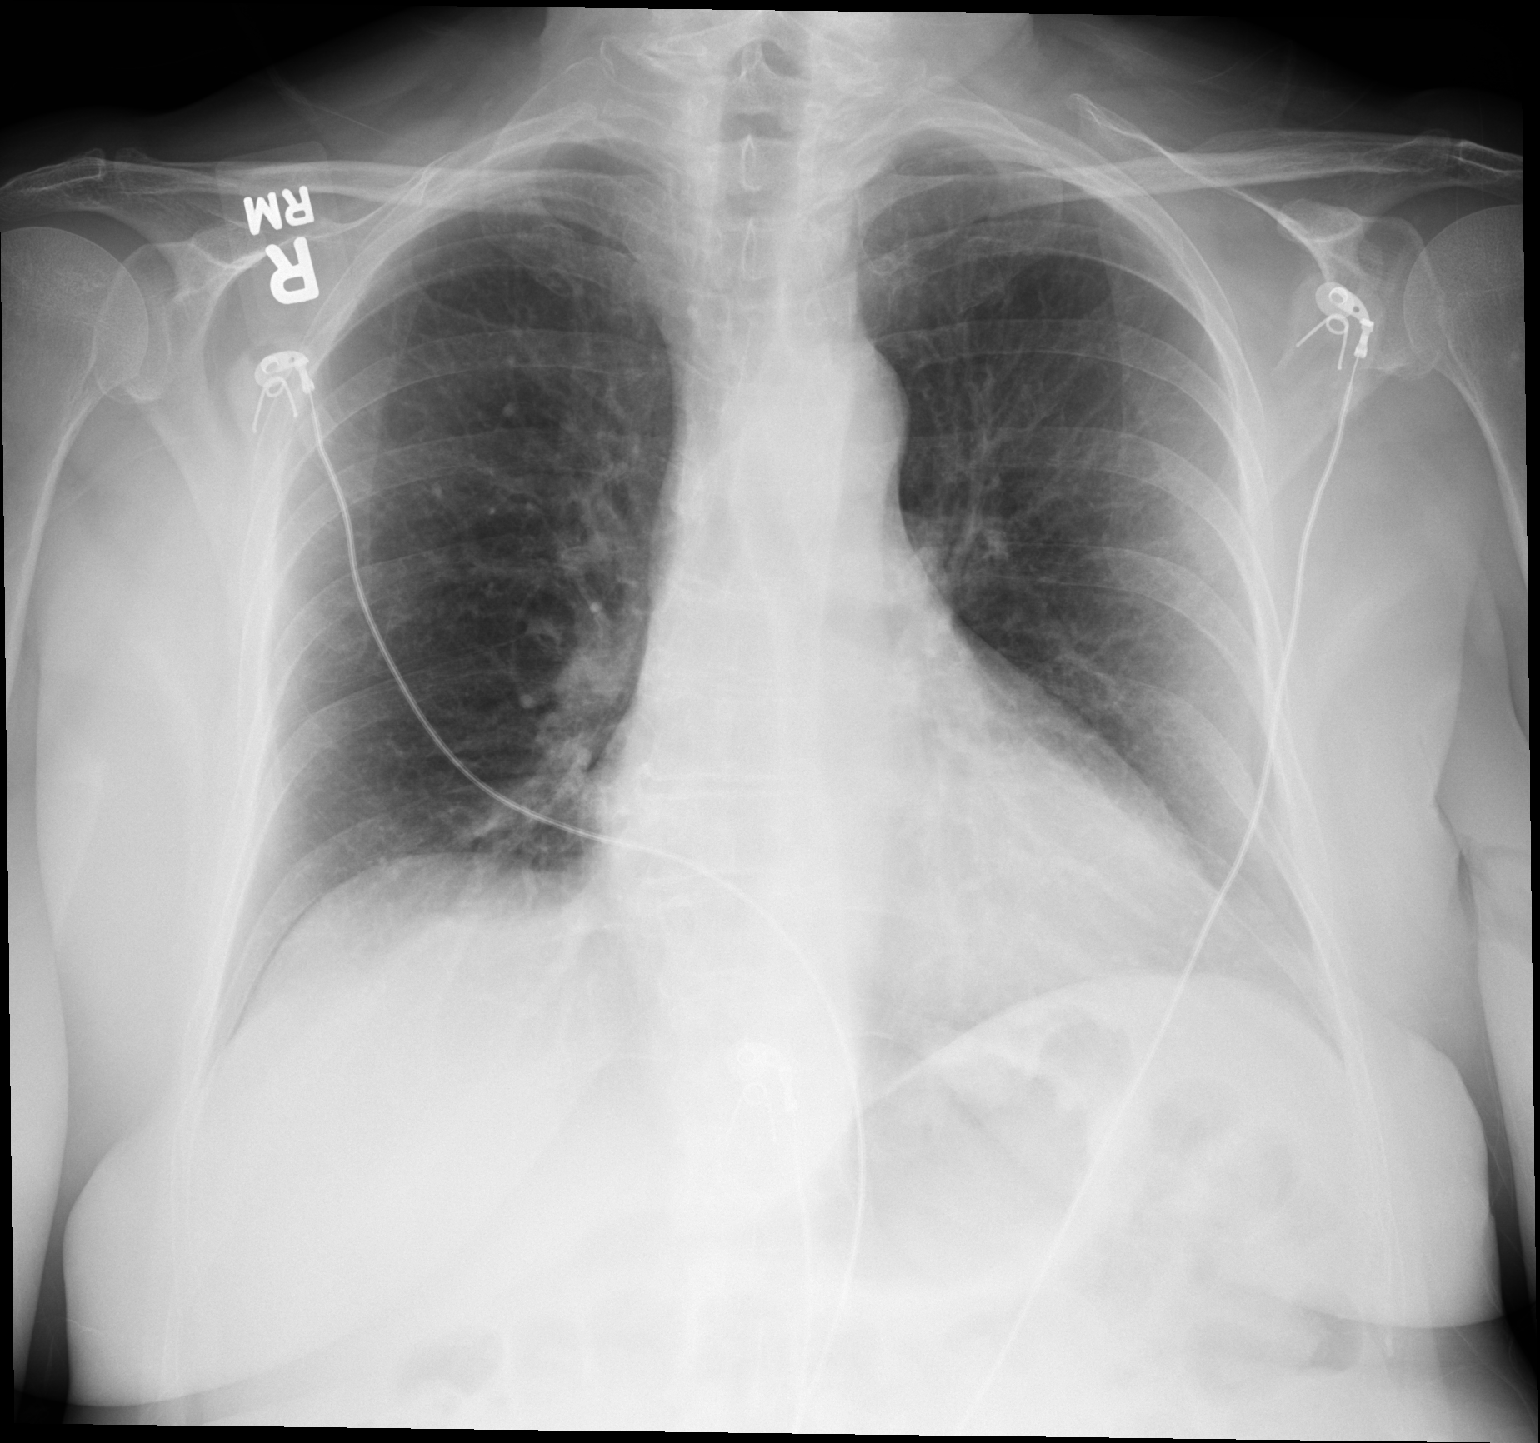

[1 of 1 positions shown; findings below may reference images not displayed]

FINDINGS: SUPPORT DEVICES: None.
HEART /PULMONARY VASCULATURE: Heart is upper normal in size. No significant pulmonary vasculature congestion.
LUNGS / PLEURA: No acute pulmonary or pleural abnormality. No pneumothorax.
IMPRESSION: 1. No acute cardiopulmonary abnormality.
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
## 2024-07-12 IMAGING — MR MRI BRAIN WITH AND WITHOUT CONTRAST
17 series · 48 of 48 positions shown · non-contrast
Comparison: CT 06/09/2021. MRI 04/04/2023.

FINAL REPORT:
MRI BRAIN WITH AND WITHOUT CONTRAST
Post-traumatic headache, unspecified, not intractable
INDICATION: Post-traumatic headache, unspecified, not intractable
TECHNIQUE: Multiplanar multisequence magnetic resonance imaging of the brain was
performed with and without contrast. Intravenous contrast material was
administered for the examination.

[Series 1001: survey · sagittal · 1.6mm · 1.62mm/px · 7 of 128 slices shown]
[im 1/128]
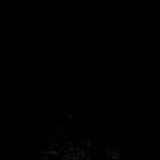
[im 22/128]
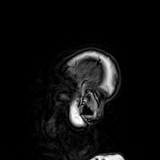
[im 43/128]
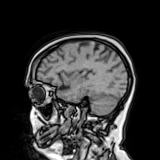
[im 64/128]
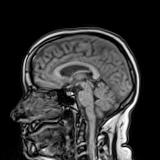
[im 85/128]
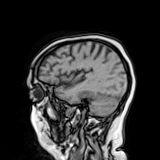
[im 106/128]
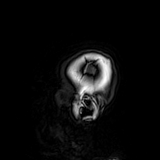
[im 128/128]
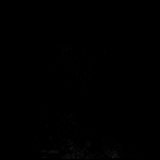

[Series 2001: survey_mpr_sag · sagittal · 1.6mm · 1.60mm/px · 1 of 5 slices shown]
[im 1/5]
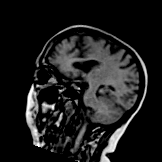

[Series 3001: survey_mpr_cor · coronal · 1.6mm · 1.60mm/px · 1 of 3 slices shown]
[im 1/3]
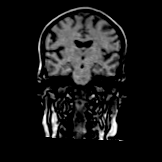

[Series 4001: survey_mpr_(person_name) · axial · 1.6mm · 1.60mm/px · 1 of 3 slices shown]
[im 1/3]
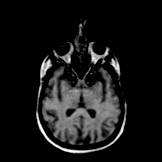

[Series 5001: T1 · sagittal · 5.0mm · 0.40mm/px · 1 of 27 slices shown (1 of 2)]
[im 1/27]
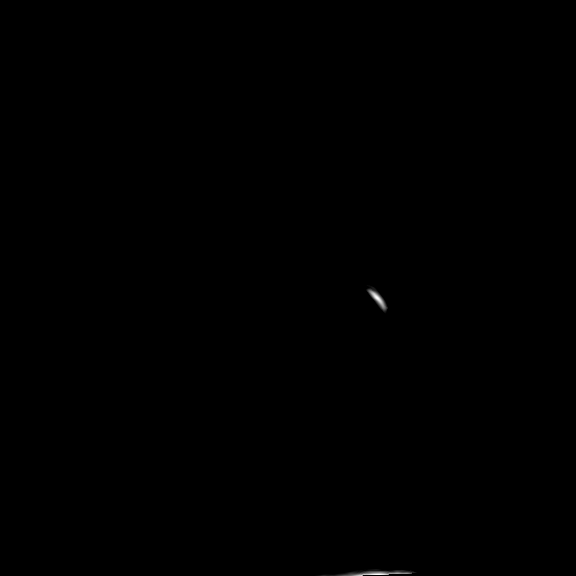

[Series 6001: ax dwi_tracew · axial · 5.0mm · 0.60mm/px · 1 of 25 slices shown (1 of 2)]
[im 1/25]
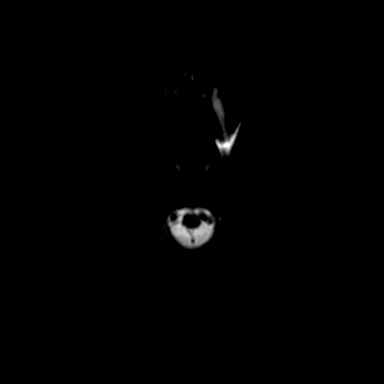

[Series 6002: ax dwi_tracew · axial · 5.0mm · 0.60mm/px · 1 of 25 slices shown (2 of 2)]
[im 1/25]
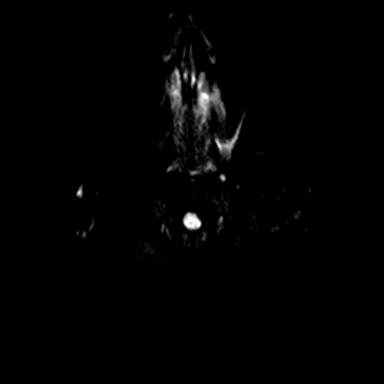

[Series 7001: ax dwi_adc · axial · 5.0mm · 0.60mm/px · 1 of 25 slices shown]
[im 1/25]
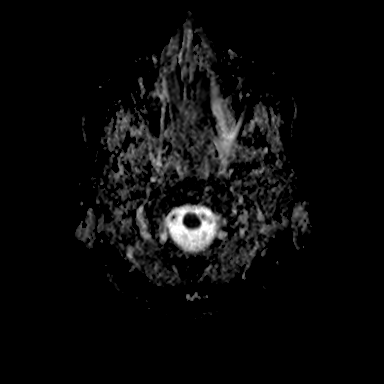

[Series 8001: ax dwi_exp · axial · 5.0mm · 0.60mm/px · 1 of 25 slices shown]
[im 1/25]
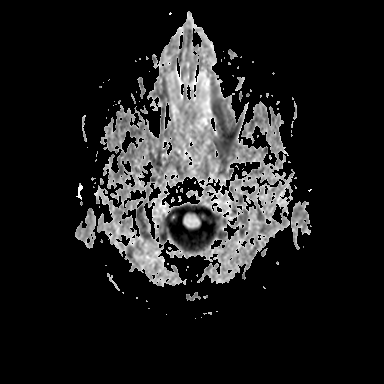

[Series 9001: T2 · axial · 5.0mm · 0.51mm/px · 1 of 25 slices shown]
[im 1/25]
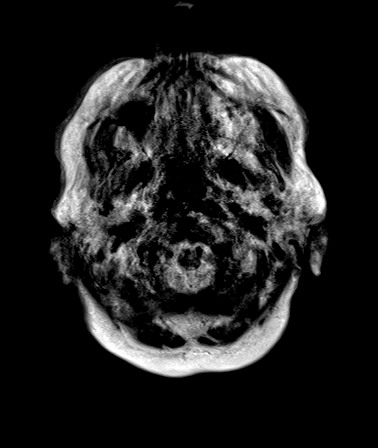

[T1 · axial · 5.0mm · 0.72mm/px · 1 of 25 slices shown (2 of 2)]
[im 1/25]
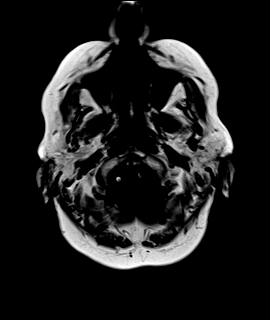

[FLAIR · axial · 5.0mm · 0.72mm/px · 1 of 25 slices shown]
[im 1/25]
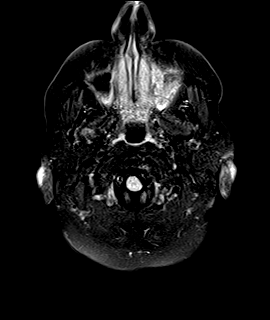

[GRE · axial · 5.0mm · 0.45mm/px · 1 of 25 slices shown]
[im 1/25]
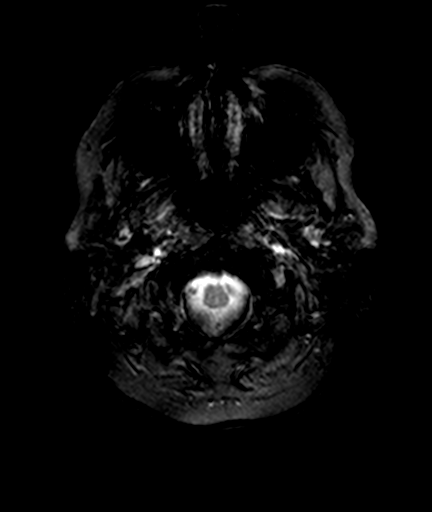

[T1 post-contrast · axial · 5.0mm · 0.72mm/px · 1 of 25 slices shown]
[im 1/25]
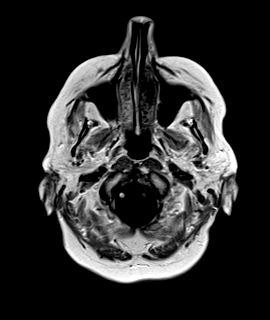

[t1_mprage_(person_name)_(person_name)2_iso_1.0 post · axial · 1.0mm · 0.97mm/px · z∈[+0,+170]mm · 9 of 171 slices shown]
[im 1/171]
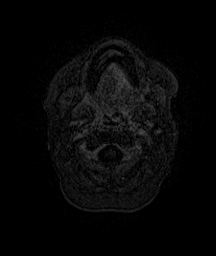
[im 22/171]
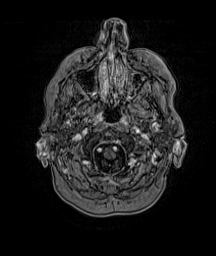
[im 43/171]
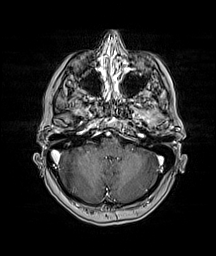
[im 64/171]
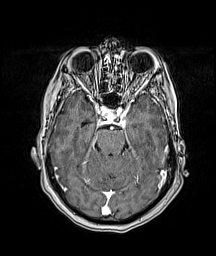
[im 86/171]
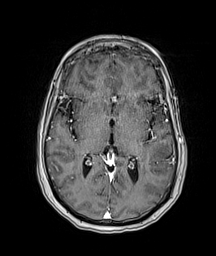
[im 107/171]
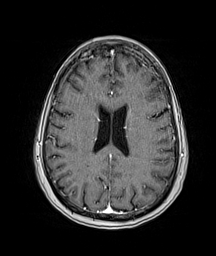
[im 128/171]
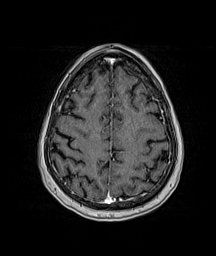
[im 149/171]
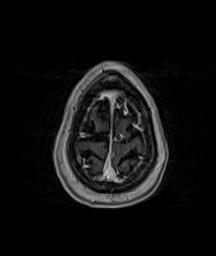
[im 171/171]
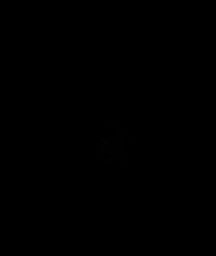

[t1_mprage_(person_name)_(person_name)2_iso_1.0 post_mpr_sag · sagittal · 1.0mm · 0.97mm/px · 8 of 150 slices shown]
[im 1/150]
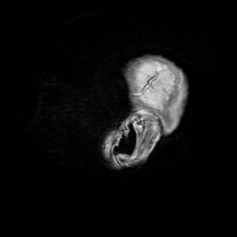
[im 22/150]
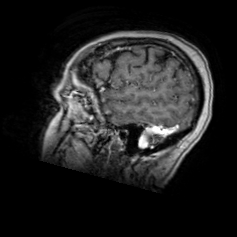
[im 43/150]
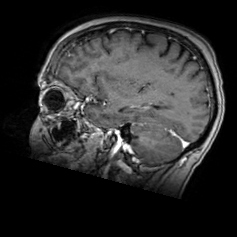
[im 64/150]
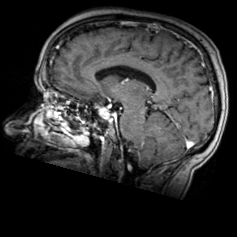
[im 86/150]
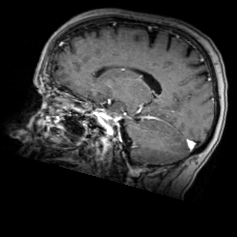
[im 107/150]
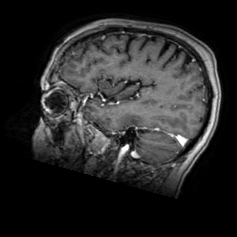
[im 128/150]
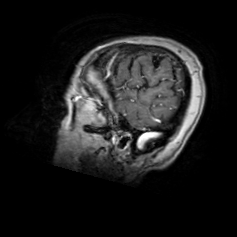
[im 150/150]
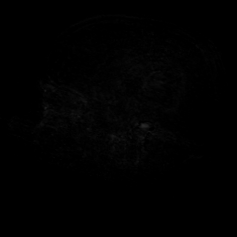

[t1_mprage_(person_name)_(person_name)2_iso_1.0 post_mpr_cor · coronal · 1.0mm · 0.97mm/px · 11 of 200 slices shown]
[im 1/200]
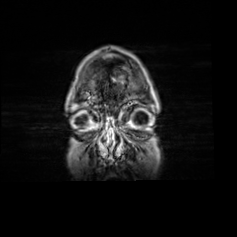
[im 20/200]
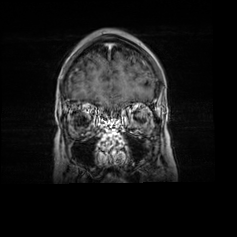
[im 40/200]
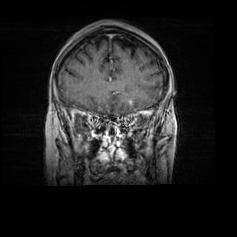
[im 60/200]
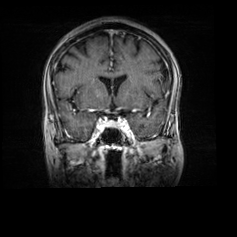
[im 80/200]
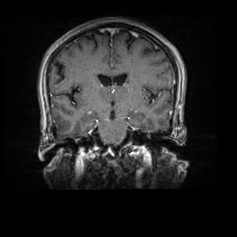
[im 100/200]
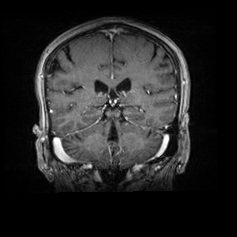
[im 120/200]
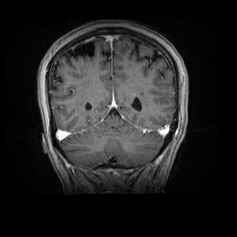
[im 140/200]
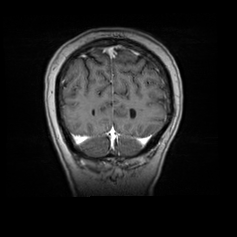
[im 160/200]
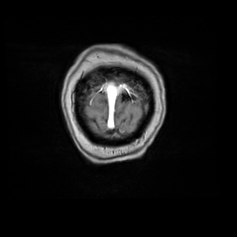
[im 180/200]
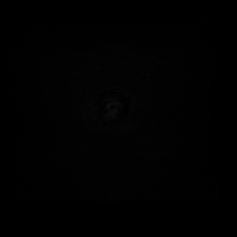
[im 200/200]
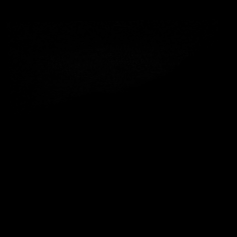

[48 of 48 positions shown; findings below may reference images not displayed]

FINDINGS: Motion-degraded examination.
Surgical/Devices: None
Parenchyma: No infarction on DWI. No acute intraparenchymal hemorrhage. No mass
or mass effect. Scattered foci of T2 hyperintensity are present in the cerebral
white matter that are nonspecific but compatible with mild chronic microvascular
ischemic changes. Previously described small focus of T2/FLAIR hyperintense
signal within the right inferior cerebellar hemisphere is not well evaluated
secondary to patient motion. No associated enhancement.
Extra-axial Collection: Partially calcified extra-axial dural based mass along
the left interhemispheric falx, measuring 1.3 x 1.2 x 0.9 cm.
Ventricular System: No hydrocephalus.
Major Intracranial Flow Voids: Flow voids are preserved.
Osseous Structures/Soft Tissues: Expected marrow signal. No soft tissue
abnormality.
Included Orbits: Bilateral lens replacements.
Paranasal Sinuses: Predominantly clear
Tympanomastoid Cavities: Predominantly clear.
Abnormal Enhancement: Left frontal lobe developmental venous anomaly (series
66886, image 39).
IMPRESSION: Motion-degraded examination.
1.  No acute intracranial abnormality.
2.  Partially calcified extra-axial dural based mass along the left
interhemispheric falx, measuring up to 1.3 cm, likely representing a meningioma.
3.  Previously described small focus of T2/FLAIR hyperintense signal within the
right inferior cerebellar hemisphere is not well evaluated secondary to patient
motion. No associated enhancement. Differential remains the same.
4.  Mild chronic microvascular ischemic changes.
[employer] Workstation ID: [id number]

## 2024-07-12 IMAGING — MR MRI CERVICAL SPINE WITHOUT CONTRAST
7 series · 48 of 48 positions shown · non-contrast
Comparison: None.

FINAL REPORT:
MRI CERVICAL SPINE WITHOUT CONTRAST
INDICATION: Left cervical radiculopathy.
TECHNIQUE: Multiplanar multisequence MR imaging of the cervical spine was performed without contrast.

[Series 3001: survey_mpr_sag · sagittal · 1.7mm · 1.67mm/px · 6 of 15 slices shown]
[im 1/15]
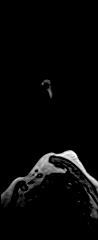
[im 3/15]
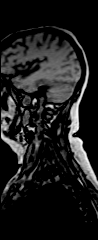
[im 6/15]
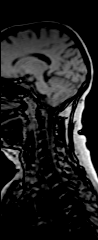
[im 9/15]
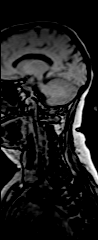
[im 12/15]
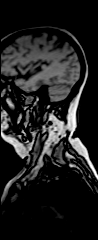
[im 15/15]
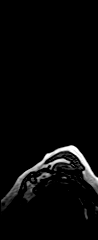

[Series 4001: survey_mpr_(person_name) · axial · 1.7mm · 1.67mm/px · z∈[-150,+150]mm · 2 of 7 slices shown]
[im 1/7]
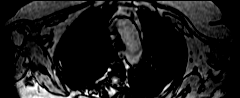
[im 7/7]
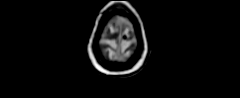

[Series 5001: T2 · sagittal · 3.0mm · 0.57mm/px · 5 of 17 slices shown (1 of 2)]
[im 1/17]
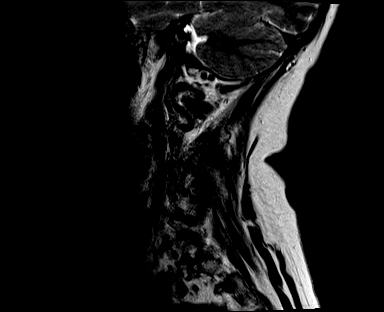
[im 5/17]
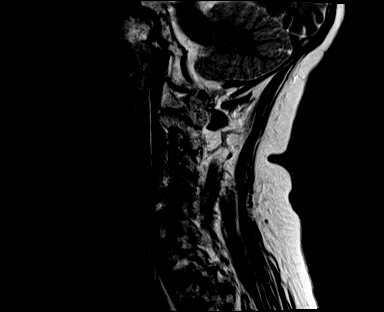
[im 9/17]
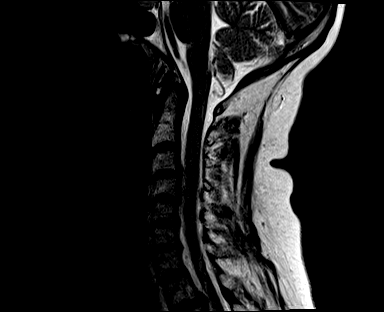
[im 13/17]
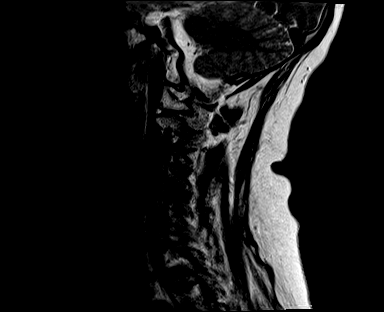
[im 17/17]
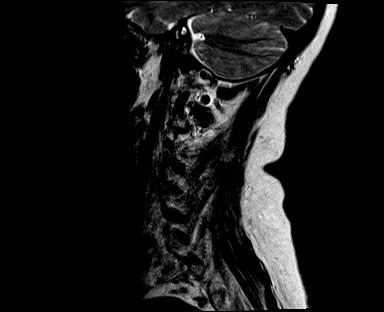

[Series 6001: T1 · sagittal · 3.0mm · 0.69mm/px · 5 of 17 slices shown]
[im 1/17]
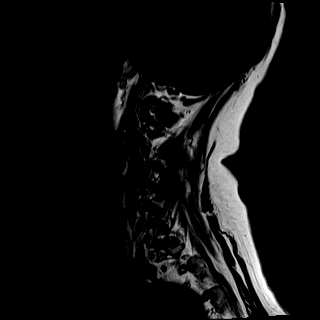
[im 5/17]
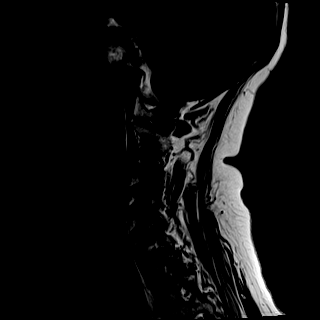
[im 9/17]
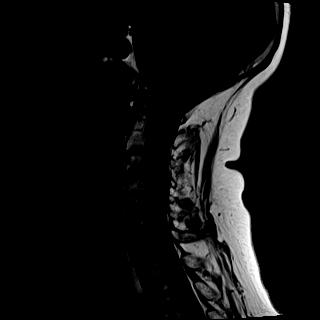
[im 13/17]
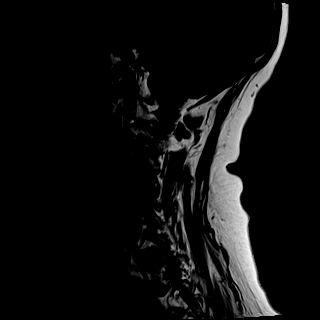
[im 17/17]
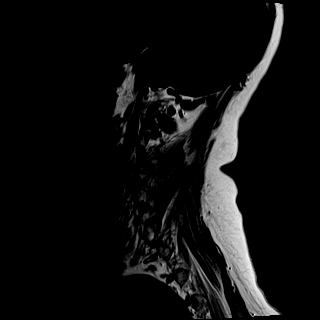

[Series 7001: STIR · sagittal · 3.0mm · 0.86mm/px · 5 of 17 slices shown]
[im 1/17]
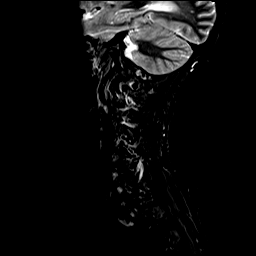
[im 5/17]
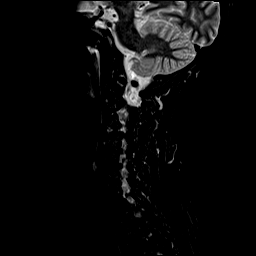
[im 9/17]
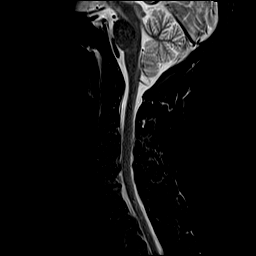
[im 13/17]
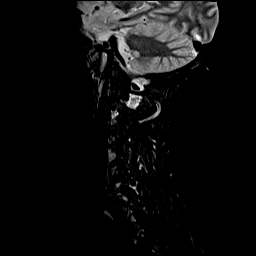
[im 17/17]
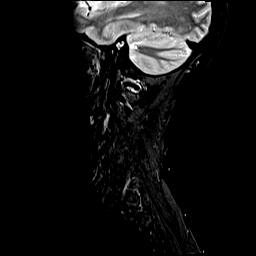

[Series 8001: GRE · axial · 3.0mm · 0.70mm/px · z∈[-118,-24]mm · 10 of 30 slices shown]
[im 1/30]
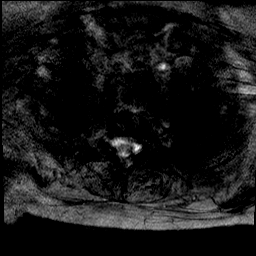
[im 4/30]
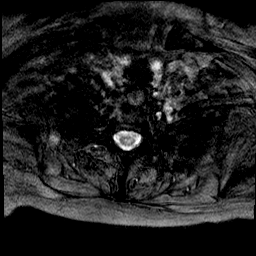
[im 7/30]
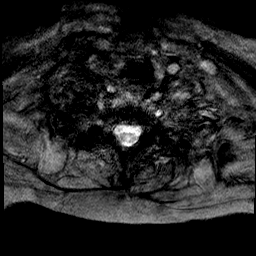
[im 10/30]
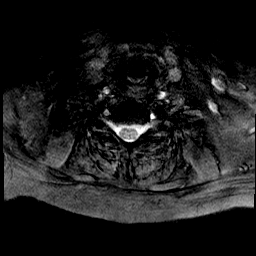
[im 13/30]
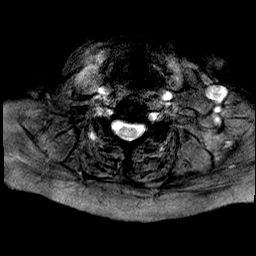
[im 17/30]
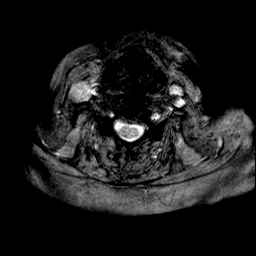
[im 20/30]
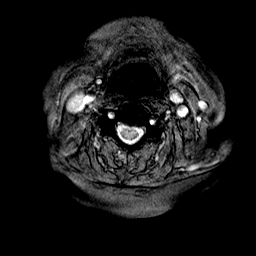
[im 23/30]
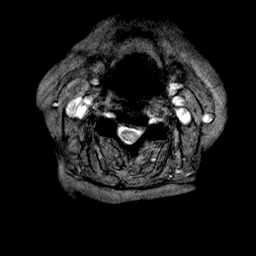
[im 26/30]
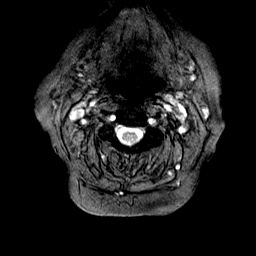
[im 30/30]
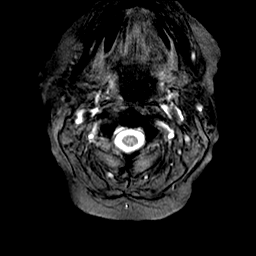

[Series 9001: T2 · axial · 2.0mm · 0.70mm/px · z∈[-117,-25]mm · 15 of 48 slices shown (2 of 2)]
[im 1/48]
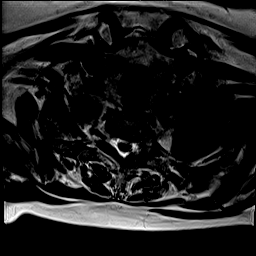
[im 4/48]
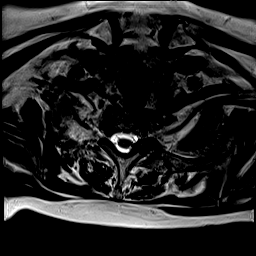
[im 7/48]
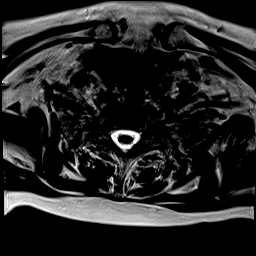
[im 11/48]
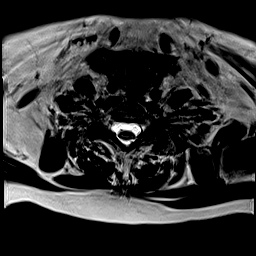
[im 14/48]
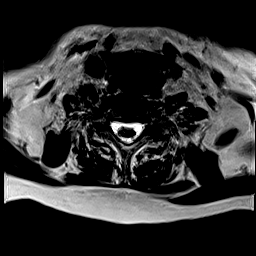
[im 17/48]
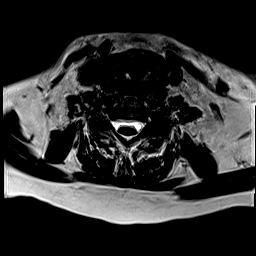
[im 21/48]
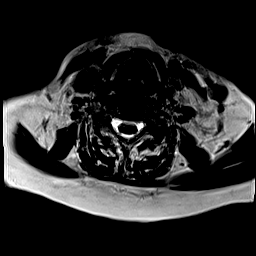
[im 24/48]
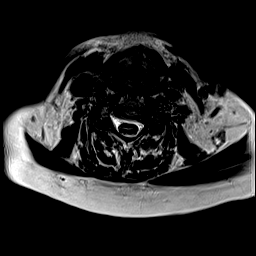
[im 27/48]
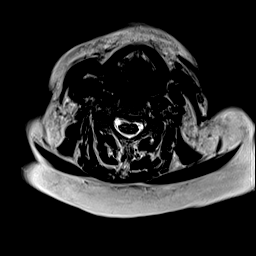
[im 31/48]
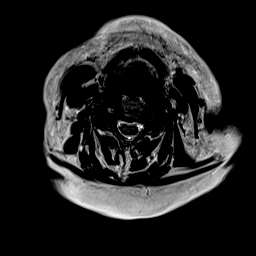
[im 34/48]
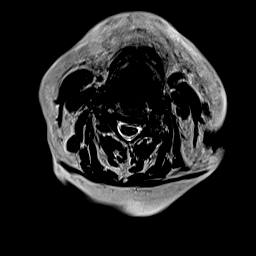
[im 37/48]
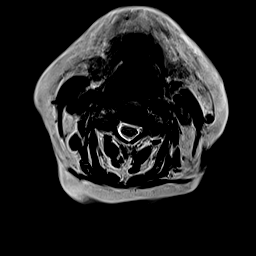
[im 41/48]
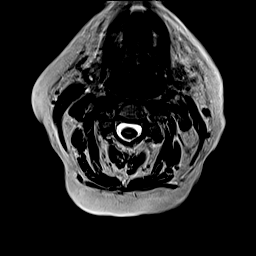
[im 44/48]
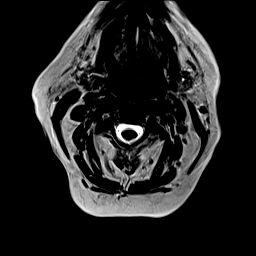
[im 48/48]
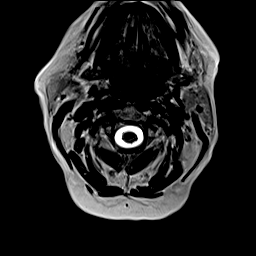

[48 of 48 positions shown; findings below may reference images not displayed]

FINDINGS: There is grade 1 anterolisthesis at C3-4 and C7-T1. The vertebral body heights are preserved. Disc space narrowing at C3-4 through C6-7. Fatty endplate changes at C6-7. Minimal fibrovascular endplate changes at C3-4 through C5-6. No acute fracture. No suspicious marrow lesions.
The imaged portions of the brain are unremarkable. No cord compression or cord signal abnormality.
C2-3: Facet and uncovertebral hypertrophy causing mild right and moderate left neural femoral narrowing. No canal stenosis.
C3-4: Disc osteophyte complex with facet and uncovertebral hypertrophy causing mild to moderate canal stenosis with severe bilateral neural femoral narrowing.
C4-5: Disc osteophyte complex with facet and uncovertebral hypertrophy causing mild to moderate canal stenosis with severe left and moderate right neural femoral narrowing.
C5-6: Disc osteophyte complex with facet and uncovertebral hypertrophy causing mild canal stenosis with moderate right and severe left neural femoral narrowing.
C6-7: Disc osteophyte complex with facet and uncovertebral hypertrophy causing severe bilateral neural femoral narrowing. No significant canal stenosis.
C7-T1: No significant canal or neural foraminal stenosis.
The pre and paravertebral soft tissues are unremarkable.
IMPRESSION: 
IMPRESSION: Multilevel cervical spondylosis with mild to moderate canal stenosis at C3-4 and C4-5. Severe neural femoral narrowing bilaterally at C3-4, on the left at C4-5, on the left at C5-6, and bilaterally at C6-7.
